# Patient Record
Sex: Female | Born: 1970 | Race: Black or African American | Hispanic: No | Marital: Single | Smoking: Never smoker
Health system: Southern US, Community
[De-identification: ages and names within clinical notes are randomized; demographics above are authoritative.]

## PROBLEM LIST (undated history)

## (undated) DIAGNOSIS — R001 Bradycardia, unspecified: Secondary | ICD-10-CM

## (undated) DIAGNOSIS — G4733 Obstructive sleep apnea (adult) (pediatric): Secondary | ICD-10-CM

## (undated) HISTORY — DX: Bradycardia, unspecified: R00.1

## (undated) HISTORY — PX: TUBAL LIGATION: SHX77

## (undated) HISTORY — DX: Obstructive sleep apnea (adult) (pediatric): G47.33

---

## 2020-04-14 ENCOUNTER — Ambulatory Visit
Admission: EM | Admit: 2020-04-14 | Discharge: 2020-04-14 | Disposition: A | Discharge status: Home / Self Care | Payer: 59 | Attending: Emergency Medicine | Admitting: Emergency Medicine

## 2020-04-14 ENCOUNTER — Other Ambulatory Visit: Payer: Self-pay

## 2020-04-14 ENCOUNTER — Encounter: Payer: Self-pay | Admitting: Emergency Medicine

## 2020-04-14 DIAGNOSIS — M778 Other enthesopathies, not elsewhere classified: Secondary | ICD-10-CM

## 2020-04-14 MED ORDER — CYCLOBENZAPRINE HCL 10 MG PO TABS: 10.0000 mg | ORAL_TABLET | Freq: Every day | ORAL | 0 refills | Status: DC

## 2020-04-14 MED ORDER — PREDNISONE 5 MG/5ML PO SOLN: 5.0000 mg | Freq: Every day | ORAL | 0 refills | Status: DC

## 2020-04-14 NOTE — ED Provider Notes (Signed)
[facility]   [id number] 04/14/20 Arrival Time: 1354   Chief Complaint  Patient presents with  . Shoulder Pain     SUBJECTIVE: History from: patient and family.  Sonya Gray is a 50 y.o. female who presented to the urgent care with a complaint of right shoulder pain that started this past Monday.  Denies any precipitating event, trauma or injury.  Localizes the pain to the right shoulder.  Has tried OTC medication with mild relief.  Report worsening symptoms with range of motion.  Denies similar symptoms in the past.  Denies chills, fever, nausea, vomiting, diarrhea.  ROS: As per HPI.  All other pertinent ROS negative.      History reviewed. No pertinent past medical history. History reviewed. No pertinent surgical history. Allergies  Allergen Reactions  . Cepacol [Benzocaine-Menthol] Hives  . Compazine [Prochlorperazine]    No current facility-administered medications on file prior to encounter.   No current outpatient medications on file prior to encounter.   Social History   Socioeconomic History  . Marital status: Single    Spouse name: Not on file  . Number of children: Not on file  . Years of education: Not on file  . Highest education level: Not on file  Occupational History  . Not on file  Tobacco Use  . Smoking status: Never Smoker  . Smokeless tobacco: Never Used  Vaping Use  . Vaping Use: Never used  Substance and Sexual Activity  . Alcohol use: Yes    Comment: occassionally  . Drug use: Never  . Sexual activity: Not on file  Other Topics Concern  . Not on file  Social History Narrative  . Not on file   Social Determinants of Health   Financial Resource Strain: Not on file  Food Insecurity: Not on file  Transportation Needs: Not on file  Physical Activity: Not on file  Stress: Not on file  Social Connections: Not on file  Intimate Partner Violence: Not on file   Family History  Problem Relation Age of Onset  . Heart failure  Mother   . Cancer Father   . Cancer Sister     OBJECTIVE:  Vitals:   04/14/20 1720 04/14/20 1721  BP: (!) 169/71   Pulse: 69   Resp: 18   Temp: 98.5 F (36.9 C)   TempSrc: Oral   SpO2: 97%   Weight:  180 lb (81.6 kg)  Height:  4\' 11"  (1.499 m)     Physical Exam Vitals and nursing note reviewed.  Constitutional:      General: She is not in acute distress.    Appearance: Normal appearance. She is normal weight. She is not ill-appearing, toxic-appearing or diaphoretic.  HENT:     Head: Normocephalic.  Cardiovascular:     Rate and Rhythm: Normal rate and regular rhythm.     Pulses: Normal pulses.     Heart sounds: Normal heart sounds. No murmur heard. No friction rub. No gallop.   Pulmonary:     Effort: Pulmonary effort is normal. No respiratory distress.     Breath sounds: Normal breath sounds. No stridor. No wheezing, rhonchi or rales.  Chest:     Chest wall: No tenderness.  Musculoskeletal:        General: Tenderness present.     Comments: The right shoulder is without any obvious asymmetry or deformity when compared to the left shoulder.  There is no ecchymosis, open wound, lesion, warmth, swelling or surface trauma present.  Limited  range of motion due to pain.  Neurovascular status intact.  Neurological:     Mental Status: She is alert and oriented to person, place, and time.     LABS:  No results found for this or any previous visit (from the past 24 hour(s)).   ASSESSMENT & PLAN:  1. Tendinitis of right shoulder     Meds ordered this encounter  Medications  . predniSONE 5 MG/5ML solution    Sig: Take 5 mLs (5 mg total) by mouth daily with breakfast for 7 days.    Dispense:  35 mL    Refill:  0  . cyclobenzaprine (FLEXERIL) 10 MG tablet    Sig: Take 1 tablet (10 mg total) by mouth at bedtime.    Dispense:  20 tablet    Refill:  0    Discharge instructions  Prednisone was prescribed/take as directed Flexeril was prescribed/take as directed.  This  medication can make you sleepy therefore do not drive or operate machinery while taking this medication Follow PCP Continue to take OTC Tylenol as needed for pain Return or go to ED if you develop any new or worsening of his symptoms  Reviewed expectations re: course of current medical issues. Questions answered. Outlined signs and symptoms indicating need for more acute intervention. Patient verbalized understanding. After Visit Summary given.         Durward Parcel, FNP 04/14/20 1755

## 2020-04-14 NOTE — ED Triage Notes (Signed)
Arm pain started on Monday today it is hard to raise arm up.

## 2020-04-14 NOTE — Discharge Instructions (Addendum)
Prednisone was prescribed/take as directed Flexeril was prescribed/take as directed.  This medication can make you sleepy therefore do not drive or operate machinery while taking this medication Follow PCP Continue to take OTC Tylenol as needed for pain Return or go to ED if you develop any new or worsening of his symptoms

## 2020-04-19 ENCOUNTER — Ambulatory Visit: Admit: 2020-04-19 | Discharge status: Absent without leave | Payer: Medicaid Other

## 2020-04-19 ENCOUNTER — Ambulatory Visit (INDEPENDENT_AMBULATORY_CARE_PROVIDER_SITE_OTHER): Payer: 59

## 2020-04-19 ENCOUNTER — Ambulatory Visit
Admission: EM | Admit: 2020-04-19 | Discharge: 2020-04-19 | Disposition: A | Discharge status: Home / Self Care | Payer: 59 | Attending: Emergency Medicine | Admitting: Emergency Medicine

## 2020-04-19 ENCOUNTER — Encounter: Payer: Self-pay | Admitting: Emergency Medicine

## 2020-04-19 DIAGNOSIS — M67911 Unspecified disorder of synovium and tendon, right shoulder: Secondary | ICD-10-CM | POA: Diagnosis not present

## 2020-04-19 DIAGNOSIS — M25511 Pain in right shoulder: Secondary | ICD-10-CM

## 2020-04-19 DIAGNOSIS — M19011 Primary osteoarthritis, right shoulder: Secondary | ICD-10-CM

## 2020-04-19 MED ORDER — PREDNISONE 10 MG (21) PO TBPK
ORAL_TABLET | Freq: Every day | ORAL | 0 refills | Status: DC
Start: 2020-04-19 — End: 2020-10-26

## 2020-04-19 MED ORDER — DEXAMETHASONE SODIUM PHOSPHATE 10 MG/ML IJ SOLN
10.0000 mg | Freq: Once | INTRAMUSCULAR | Status: AC
  Administered 2020-04-19: 10 mg via INTRAMUSCULAR

## 2020-04-19 MED ORDER — ACETAMINOPHEN 500 MG PO TABS: 500.0000 mg | ORAL_TABLET | Freq: Four times a day (QID) | ORAL | 0 refills | Status: DC | PRN

## 2020-04-19 NOTE — Discharge Instructions (Addendum)
Prednisone was prescribed/take as directed Decadron IM was given in office Continue to take OTC Tylenol as needed for pain Continue to take Flexeril as needed for muscle spasm Follow-up with PCP Follow RICE instructions that is attached Return or go to ED if you develop any new or worsening of your symptoms

## 2020-04-19 NOTE — ED Provider Notes (Signed)
[facility]   [id number] 04/19/20 Arrival Time: 1325   Chief Complaint  Patient presents with  . Shoulder Pain     SUBJECTIVE: History from: patient.  Sonya Gray is a 50 y.o. female who presented to the urgent care with a complaint of right shoulder pain for the past 10 days.  Denies a precipitating event, trauma or injury.  Was seen previously at the urgent care and was prescribed low-dose prednisone and muscle relaxer with no symptom improvement.  She localizes the pain to the right shoulder.  She describes the pain as constant and achy.  She has tried OTC Tylenol without relief.  Her symptoms are made worse with ROM.  He denies similar symptoms in the past.      ROS: As per HPI.  All other pertinent ROS negative.     History reviewed. No pertinent past medical history. History reviewed. No pertinent surgical history. Allergies  Allergen Reactions  . Cepacol [Benzocaine-Menthol] Hives  . Compazine [Prochlorperazine]    No current facility-administered medications on file prior to encounter.   Current Outpatient Medications on File Prior to Encounter  Medication Sig Dispense Refill  . cyclobenzaprine (FLEXERIL) 10 MG tablet Take 1 tablet (10 mg total) by mouth at bedtime. 20 tablet 0   Social History   Socioeconomic History  . Marital status: Single    Spouse name: Not on file  . Number of children: Not on file  . Years of education: Not on file  . Highest education level: Not on file  Occupational History  . Not on file  Tobacco Use  . Smoking status: Never Smoker  . Smokeless tobacco: Never Used  Vaping Use  . Vaping Use: Never used  Substance and Sexual Activity  . Alcohol use: Yes    Comment: occassionally  . Drug use: Never  . Sexual activity: Not on file  Other Topics Concern  . Not on file  Social History Narrative  . Not on file   Social Determinants of Health   Financial Resource Strain: Not on file  Food Insecurity: Not on file   Transportation Needs: Not on file  Physical Activity: Not on file  Stress: Not on file  Social Connections: Not on file  Intimate Partner Violence: Not on file   Family History  Problem Relation Age of Onset  . Heart failure Mother   . Cancer Father   . Cancer Sister     OBJECTIVE:  Vitals:   04/19/20 1543 04/19/20 1544  BP: (!) 154/89   Pulse: 77   Resp: 18   Temp: 97.8 F (36.6 C)   TempSrc: Oral   SpO2: 96%   Weight:  180 lb (81.6 kg)  Height:  4\' 11"  (1.499 m)     Physical Exam Vitals and nursing note reviewed.  Constitutional:      General: She is not in acute distress.    Appearance: Normal appearance. She is normal weight. She is not ill-appearing, toxic-appearing or diaphoretic.  HENT:     Head: Normocephalic.  Cardiovascular:     Rate and Rhythm: Normal rate and regular rhythm.     Pulses: Normal pulses.     Heart sounds: Normal heart sounds. No murmur heard. No friction rub. No gallop.   Pulmonary:     Effort: Pulmonary effort is normal. No respiratory distress.     Breath sounds: Normal breath sounds. No stridor. No wheezing, rhonchi or rales.  Chest:     Chest wall: No tenderness.  Musculoskeletal:     Right shoulder: Tenderness present.     Comments: The right shoulder is without any obvious asymmetry or deformity when compared to the left shoulder.  There is no ecchymosis, open wound, lesion, warmth or swelling present.  Limited range of motion due to pain.  Neurovascular status intact.  Neurological:     Mental Status: She is alert and oriented to person, place, and time.      LABS:  No results found for this or any previous visit (from the past 24 hour(s)).   RADIOLOGY:  DG Shoulder Right  Result Date: 04/19/2020 CLINICAL DATA:  Right shoulder pain for 1 week.  No known injury. EXAM: RIGHT SHOULDER - 2+ VIEW COMPARISON:  None. FINDINGS: No acute bony or joint abnormality is seen. The patient has mild to moderate acromioclavicular  osteoarthritis. The glenohumeral joint appears normal. Calcifications over the greater tuberosity are consistent with rotator cuff tendinopathy. IMPRESSION: No acute finding. Mild to moderate acromioclavicular osteoarthritis. Calcific rotator cuff tendinopathy. Electronically Signed   By: Drusilla Kanner M.D.   On: 04/19/2020 16:06    Right shoulder x-ray is positive for mild to moderate acromioclavicular osteoarthritis and rotator cuff tendinopathy.  I have reviewed the x-ray myself and the radiologist interpretation.  I am in agreement with the radiologist interpretation.    ASSESSMENT & PLAN:  1. Primary osteoarthritis of right shoulder   2. Tendinopathy of right rotator cuff     Meds ordered this encounter  Medications  . dexamethasone (DECADRON) injection 10 mg  . predniSONE (STERAPRED UNI-PAK 21 TAB) 10 MG (21) TBPK tablet    Sig: Take by mouth daily. Take 6 tabs by mouth daily  for 1 days, then 5 tabs for 1 days, then 4 tabs for 1 days, then 3 tabs for 1 days, 2 tabs for 1 days, then 1 tab by mouth daily for 1 days    Dispense:  21 tablet    Refill:  0    Discharge instructions  Prednisone was prescribed/take as directed Decadron IM was given in office Continue to take OTC Tylenol as needed for pain Continue to take Flexeril as needed for muscle spasm Follow-up with PCP Follow RICE instructions that is attached Return or go to ED if you develop any new or worsening of your symptoms  Reviewed expectations re: course of current medical issues. Questions answered. Outlined signs and symptoms indicating need for more acute intervention. Patient verbalized understanding. After Visit Summary given.         Durward Parcel, FNP 04/19/20 1626

## 2020-04-19 NOTE — ED Triage Notes (Signed)
States steroids and muscle relaxers are not working.  Pain since 1/2

## 2020-10-12 ENCOUNTER — Telehealth: Payer: Self-pay

## 2020-10-12 NOTE — Telephone Encounter (Signed)
Pt contacted [facility] to receive assistance with resouces to receive to establish care with PCP  Chief Complaint(s) Wants to establish care with PCP obtain evalution of her eye after poking her with a tool similar to a make up brush.  States she has a bump on her eye lid, some swelling that has improved, some eye redness  Plan -Reviewed program services and requirements and completed intake screening - Pt was referred to [facility] Program for enrollment and eligibility -Appt was scheduled for Fri. October 15, 2020 at 9:30am with [facility]  - Pt was advised if symptoms worsened (discharge, excessive pain, redness, or any signs of infection persists before getting to PCP then seek urgent or emergency care -Basic first aid measures of warm compress to eye and stye ointment OTC along with good hand hygiene prior to touching eye was advised until able to be seen by PCP

## 2020-10-15 ENCOUNTER — Telehealth: Payer: Self-pay

## 2020-10-15 NOTE — Telephone Encounter (Signed)
Called client who recently enrolled in [facility] to assist in setting up a new patient appointment to establish primary medical care with [facility] client scheduled for 10/26/20 at 1:15PM. Client provided with contact information for the [facility] as well as address.  Plan: follow up with client by phone after her first visit , client agreeable. Mailed client Care manager's contact information as well as a welcome letter from [facility].  Francee Nodal RN [facility] [employer]

## 2020-10-20 ENCOUNTER — Telehealth: Payer: Self-pay

## 2020-10-20 NOTE — Telephone Encounter (Signed)
Client called regarding [facility] upcoming [facility] at 1:15p. Client states she does not have reliable transportation. Discussed using [facility] transportation. . Client agreeable. Plan: called [facility] transportation and arranged transport for 10/26/20. [place] waiver will be signed with [facility] transportation. Will plan follow up with after her initial primary care visit.   Francee Nodal RN [facility]

## 2020-10-21 ENCOUNTER — Telehealth: Payer: Self-pay

## 2020-10-21 NOTE — Telephone Encounter (Signed)
   Sonya Gray DOB: February 25, 1971 MRN: [id number]   RIDER WAIVER AND RELEASE OF LIABILITY  For purposes of improving physical access to our facilities, [facility] is pleased to partner with third parties to provide [facility] patients or other authorized individuals the option of convenient, on-demand ground transportation services (the [employer]") through use of the technology service that enables users to request on-demand ground transportation from independent third-party providers.  By opting to use and accept these [employer], I, the undersigned, hereby agree on behalf of myself, and on behalf of any minor child using the Science writer for whom I am the parent or legal guardian, as follows:  Science writer provided to me are provided by independent third-party transportation providers who are not [employer] or employees and who are unaffiliated with [employer]. [facility] is neither a transportation carrier nor a common or public carrier. [facility] has no control over the quality or safety of the transportation that occurs as a result of the [employer]. [facility] cannot guarantee that any third-party transportation provider will complete any arranged transportation service. [facility] makes no representation, warranty, or guarantee regarding the reliability, timeliness, quality, safety, suitability, or availability of any of the Transport Services or that they will be error free. I fully understand that traveling by vehicle involves risks and dangers of serious bodily injury, including permanent disability, paralysis, and death. I agree, on behalf of myself and on behalf of any minor child using the Transport Services for whom I am the parent or legal guardian, that the entire risk arising out of my use of the [employer] remains solely with me, to the maximum extent permitted under applicable law. The [employer] are provided "as  is" and "as available." [facility] disclaims all representations and warranties, express, implied or statutory, not expressly set out in these terms, including the implied warranties of merchantability and fitness for a particular purpose. I hereby waive and release [facility], its agents, employees, officers, directors, representatives, insurers, attorneys, assigns, successors, subsidiaries, and affiliates from any and all past, present, or future claims, demands, liabilities, actions, causes of action, or suits of any kind directly or indirectly arising from acceptance and use of the [employer]. I further waive and release [facility] and its affiliates from all present and future liability and responsibility for any injury or death to persons or damages to property caused by or related to the use of the [employer]. I have read this Waiver and Release of Liability, and I understand the terms used in it and their legal significance. This Waiver is freely and voluntarily given with the understanding that my right (as well as the right of any minor child for whom I am the parent or legal guardian using the [employer]) to legal recourse against [facility] in connection with the [employer] is knowingly surrendered in return for use of these services.   I attest that I read the consent document to Sonya Gray, gave Sonya Gray the opportunity to ask questions and answered the questions asked (if any). I affirm that Sonya Gray then provided consent for she's participation in this program.     Sonya Gray

## 2020-10-26 ENCOUNTER — Other Ambulatory Visit: Payer: Self-pay | Admitting: Physician Assistant

## 2020-10-26 ENCOUNTER — Telehealth: Payer: Self-pay

## 2020-10-26 ENCOUNTER — Ambulatory Visit: Payer: 59 | Admitting: Physician Assistant

## 2020-10-26 ENCOUNTER — Other Ambulatory Visit: Payer: Self-pay

## 2020-10-26 ENCOUNTER — Encounter: Payer: Self-pay | Admitting: Physician Assistant

## 2020-10-26 VITALS — BP 122/81 | HR 74 | Temp 97.9°F | Ht 60.5 in | Wt 209.0 lb

## 2020-10-26 DIAGNOSIS — Z131 Encounter for screening for diabetes mellitus: Secondary | ICD-10-CM

## 2020-10-26 DIAGNOSIS — Z1211 Encounter for screening for malignant neoplasm of colon: Secondary | ICD-10-CM

## 2020-10-26 DIAGNOSIS — Z1239 Encounter for other screening for malignant neoplasm of breast: Secondary | ICD-10-CM

## 2020-10-26 DIAGNOSIS — Z13220 Encounter for screening for lipoid disorders: Secondary | ICD-10-CM

## 2020-10-26 DIAGNOSIS — Z7689 Persons encountering health services in other specified circumstances: Secondary | ICD-10-CM

## 2020-10-26 NOTE — Telephone Encounter (Signed)
Client called after [facility] appointment. She states her visit went well. She called regarding transportation to have her labs drawn. Plan on 10/29/20 at 0800 to [facility].   [facility] called    Francee Nodal RN [facility]

## 2020-10-26 NOTE — Progress Notes (Signed)
BP 122/81   Pulse 74   Temp 97.9 F (36.6 C)   Ht 5' 0.5" (1.537 m)   Wt 209 lb (94.8 kg)   SpO2 99%   BMI 40.15 kg/m    Subjective:    Patient ID: Sonya Gray, female    DOB: 1970-11-18, 50 y.o.   MRN: [id number]  HPI: Sonya Gray is a 50 y.o. female presenting on 10/26/2020 for New Patient (Initial Visit)   HPI   Pt had a negative covid 19 screening questionnaire.   Chief Complaint  Patient presents with   New Patient (Initial Visit)      Pt has moved here from [place] about a year ago.  She has Not had care since moving.  She moved here to live with her son.    She does not work.   She worked for [employer] in the past.  He has history of Problems with her back.  She saw orthopedic doctors in [place] and says she was diagnosed with pinched nerves.  She says she had MRI maybe 2 years ago.   Her back doesn't prevent her from doing her usual activities.   She says it flares up some times.   Otherwise her only issue is some allergies.    She has never had a colonoscopy.  Her Last mammogram was just over a year ago.  Her Last PAP was about a year ago.     Relevant past medical, surgical, family and social history reviewed and updated as indicated. Interim medical history since our last visit reviewed. Allergies and medications reviewed and updated.   Current Outpatient Medications:    acetaminophen (TYLENOL) 500 MG tablet, Take 1 tablet (500 mg total) by mouth every 6 (six) hours as needed., Disp: 30 tablet, Rfl: 0   Review of Systems  Per HPI unless specifically indicated above     Objective:    BP 122/81   Pulse 74   Temp 97.9 F (36.6 C)   Ht 5' 0.5" (1.537 m)   Wt 209 lb (94.8 kg)   SpO2 99%   BMI 40.15 kg/m   Wt Readings from Last 3 Encounters:  10/26/20 209 lb (94.8 kg)  04/19/20 180 lb (81.6 kg)  04/14/20 180 lb (81.6 kg)    Physical Exam Vitals reviewed.  Constitutional:      General: She is not in acute distress.    Appearance: She is  well-developed. She is obese. She is not ill-appearing.  HENT:     Head: Normocephalic and atraumatic.     Right Ear: Tympanic membrane, ear canal and external ear normal.     Left Ear: Tympanic membrane, ear canal and external ear normal.  Eyes:     Extraocular Movements: Extraocular movements intact.     Conjunctiva/sclera: Conjunctivae normal.     Pupils: Pupils are equal, round, and reactive to light.  Neck:     Thyroid: No thyromegaly.  Cardiovascular:     Rate and Rhythm: Normal rate and regular rhythm.  Pulmonary:     Effort: Pulmonary effort is normal.     Breath sounds: Normal breath sounds.  Abdominal:     General: Bowel sounds are normal.     Palpations: Abdomen is soft. There is no mass.     Tenderness: There is no abdominal tenderness.  Musculoskeletal:     Cervical back: Neck supple.     Right lower leg: No edema.     Left lower leg: No edema.  Lymphadenopathy:     Cervical: No cervical adenopathy.  Skin:    General: Skin is warm and dry.  Neurological:     Mental Status: She is alert and oriented to person, place, and time.     Motor: No weakness or tremor.     Gait: Gait normal.     Deep Tendon Reflexes:     Reflex Scores:      Patellar reflexes are 2+ on the right side and 2+ on the left side. Psychiatric:        Attention and Perception: Attention normal.        Mood and Affect: Mood normal.        Speech: Speech normal.        Behavior: Behavior normal. Behavior is cooperative.     Comments: Engages easily.  Pleasant.     No results found for this or any previous visit.    Assessment & Plan:    Encounter Diagnoses  Name Primary?   Encounter to establish care Yes   Morbid obesity (HCC)    Screening for colon cancer    Encounter for screening for malignant neoplasm of breast, unspecified screening modality    Screening cholesterol level    Screening for diabetes mellitus      -pt was given FIT test for colon cancer screening -Record request  sent for most recent PAP and orthopedic records/MRI -will refer for screening Mammogram -pt is put on Dental list -will get Baseline labs -encouraged weight loss with healthy diet and reguar exercise -pt to follow up  1 month.  She is to contact office sooner prn

## 2020-10-27 ENCOUNTER — Other Ambulatory Visit: Payer: Self-pay | Admitting: Physician Assistant

## 2020-10-27 DIAGNOSIS — Z1239 Encounter for other screening for malignant neoplasm of breast: Secondary | ICD-10-CM

## 2020-10-29 ENCOUNTER — Other Ambulatory Visit (HOSPITAL_COMMUNITY)
Admission: RE | Admit: 2020-10-29 | Discharge: 2020-10-29 | Disposition: A | Discharge status: Home / Self Care | Payer: Medicaid Other | Source: Ambulatory Visit | Attending: Physician Assistant | Admitting: Physician Assistant

## 2020-10-29 DIAGNOSIS — Z13220 Encounter for screening for lipoid disorders: Secondary | ICD-10-CM | POA: Insufficient documentation

## 2020-10-29 DIAGNOSIS — Z131 Encounter for screening for diabetes mellitus: Secondary | ICD-10-CM | POA: Insufficient documentation

## 2020-10-29 LAB — COMPREHENSIVE METABOLIC PANEL
ALT: 19 U/L (ref 0–44)
AST: 17 U/L (ref 15–41)
Albumin: 4.1 g/dL (ref 3.5–5.0)
Alkaline Phosphatase: 64 U/L (ref 38–126)
Anion gap: 7 (ref 5–15)
BUN: 9 mg/dL (ref 6–20)
CO2: 26 mmol/L (ref 22–32)
Calcium: 8.7 mg/dL — ABNORMAL LOW (ref 8.9–10.3)
Chloride: 108 mmol/L (ref 98–111)
Creatinine, Ser: 0.64 mg/dL (ref 0.44–1.00)
GFR, Estimated: >60 mL/min (ref >60)
Glucose, Bld: 115 mg/dL — ABNORMAL HIGH (ref 70–99)
Potassium: 3.7 mmol/L (ref 3.5–5.1)
Sodium: 141 mmol/L (ref 135–145)
Total Bilirubin: 0.5 mg/dL (ref 0.3–1.2)
Total Protein: 7.2 g/dL (ref 6.5–8.1)

## 2020-10-29 LAB — LIPID PANEL
Cholesterol: 228 mg/dL — ABNORMAL HIGH (ref 0–200)
HDL: 60 mg/dL (ref >40)
LDL Cholesterol: 156 mg/dL — ABNORMAL HIGH (ref 0–99)
Total CHOL/HDL Ratio: 3.8 RATIO
Triglycerides: 58 mg/dL (ref <150)
VLDL: 12 mg/dL (ref 0–40)

## 2020-10-30 LAB — HEMOGLOBIN A1C
Hgb A1c MFr Bld: 6.1 % — ABNORMAL HIGH (ref 4.8–5.6)
Mean Plasma Glucose: 128 mg/dL

## 2020-11-04 ENCOUNTER — Other Ambulatory Visit (HOSPITAL_COMMUNITY): Payer: Self-pay | Admitting: Family Medicine

## 2020-11-04 ENCOUNTER — Ambulatory Visit (HOSPITAL_COMMUNITY)
Admission: RE | Admit: 2020-11-04 | Discharge: 2020-11-04 | Disposition: A | Discharge status: Home / Self Care | Payer: Self-pay | Source: Ambulatory Visit | Attending: Physician Assistant | Admitting: Physician Assistant

## 2020-11-04 ENCOUNTER — Other Ambulatory Visit: Payer: Self-pay

## 2020-11-04 DIAGNOSIS — Z1239 Encounter for other screening for malignant neoplasm of breast: Secondary | ICD-10-CM | POA: Insufficient documentation

## 2020-11-08 LAB — IFOBT (OCCULT BLOOD): IFOBT: NEGATIVE

## 2020-11-29 ENCOUNTER — Ambulatory Visit: Payer: Medicaid Other | Admitting: Physician Assistant

## 2020-11-29 ENCOUNTER — Encounter: Payer: Self-pay | Admitting: Physician Assistant

## 2020-11-29 DIAGNOSIS — E785 Hyperlipidemia, unspecified: Secondary | ICD-10-CM

## 2020-11-29 DIAGNOSIS — R7303 Prediabetes: Secondary | ICD-10-CM

## 2020-11-29 DIAGNOSIS — E669 Obesity, unspecified: Secondary | ICD-10-CM

## 2020-11-29 NOTE — Patient Instructions (Signed)
High Cholesterol  High cholesterol is a condition in which the blood has high levels of a white, waxy substance similar to fat (cholesterol). The liver makes all the cholesterol that the body needs. The human body needs small amounts of cholesterol to help build cells. A person gets extra orexcess cholesterol from the food that he or she eats. The blood carries cholesterol from the liver to the rest of the body. If you have high cholesterol, deposits (plaques) may build up on the walls of your arteries. Arteries are the blood vessels that carry blood away from your heart. These plaques make the arteries narrowand stiff. Cholesterol plaques increase your risk for heart attack and stroke. Work withyour health care provider to keep your cholesterol levels in a healthy range. What increases the risk? The following factors may make you more likely to develop this condition: Eating foods that are high in animal fat (saturated fat) or cholesterol. Being overweight. Not getting enough exercise. A family history of high cholesterol (familial hypercholesterolemia). Use of tobacco products. Having diabetes. What are the signs or symptoms? There are no symptoms of this condition. How is this diagnosed? This condition may be diagnosed based on the results of a blood test. If you are older than 52 years of age, your health care provider may check your cholesterol levels every 4-6 years. You may be checked more often if you have high cholesterol or other risk factors for heart disease. The blood test for cholesterol measures: "Bad" cholesterol, or LDL cholesterol. This is the main type of cholesterol that causes heart disease. The desired level is less than 100 mg/dL. "Good" cholesterol, or HDL cholesterol. HDL helps protect against heart disease by cleaning the arteries and carrying the LDL to the liver for processing. The desired level for HDL is 60 mg/dL or higher. Triglycerides. These are fats that your  body can store or burn for energy. The desired level is less than 150 mg/dL. Total cholesterol. This measures the total amount of cholesterol in your blood and includes LDL, HDL, and triglycerides. The desired level is less than 200 mg/dL. How is this treated? This condition may be treated with: Diet changes. You may be asked to eat foods that have more fiber and less saturated fats or added sugar. Lifestyle changes. These may include regular exercise, maintaining a healthy weight, and quitting use of tobacco products. Medicines. These are given when diet and lifestyle changes have not worked. You may be prescribed a statin medicine to help lower your cholesterol levels. Follow these instructions at home: Eating and drinking  Eat a healthy, balanced diet. This diet includes: Daily servings of a variety of fresh, frozen, or canned fruits and vegetables. Daily servings of whole grain foods that are rich in fiber. Foods that are low in saturated fats and trans fats. These include poultry and fish without skin, lean cuts of meat, and low-fat dairy products. A variety of fish, especially oily fish that contain omega-3 fatty acids. Aim to eat fish at least 2 times a week. Avoid foods and drinks that have added sugar. Use healthy cooking methods, such as roasting, grilling, broiling, baking, poaching, steaming, and stir-frying. Do not fry your food except for stir-frying.  Lifestyle  Get regular exercise. Aim to exercise for a total of 150 minutes a week. Increase your activity level by doing activities such as gardening, walking, and taking the stairs. Do not use any products that contain nicotine or tobacco, such as cigarettes, e-cigarettes, and chewing tobacco.   If you need help quitting, ask your health care provider.  General instructions Take over-the-counter and prescription medicines only as told by your health care provider. Keep all follow-up visits as told by your health care provider.  This is important. Where to find more information American Heart Association: www.heart.org National Heart, Lung, and Blood Institute: www.nhlbi.nih.gov Contact a health care provider if: You have trouble achieving or maintaining a healthy diet or weight. You are starting an exercise program. You are unable to stop smoking. Get help right away if: You have chest pain. You have trouble breathing. You have any symptoms of a stroke. "BE FAST" is an easy way to remember the main warning signs of a stroke: B - Balance. Signs are dizziness, sudden trouble walking, or loss of balance. E - Eyes. Signs are trouble seeing or a sudden change in vision. F - Face. Signs are sudden weakness or numbness of the face, or the face or eyelid drooping on one side. A - Arms. Signs are weakness or numbness in an arm. This happens suddenly and usually on one side of the body. S - Speech. Signs are sudden trouble speaking, slurred speech, or trouble understanding what people say. T - Time. Time to call emergency services. Write down what time symptoms started. You have other signs of a stroke, such as: A sudden, severe headache with no known cause. Nausea or vomiting. Seizure. These symptoms may represent a serious problem that is an emergency. Do not wait to see if the symptoms will go away. Get medical help right away. Call your local emergency services (911 in the [place] Do not drive yourself to the hospital. Summary Cholesterol plaques increase your risk for heart attack and stroke. Work with your health care provider to keep your cholesterol levels in a healthy range. Eat a healthy, balanced diet, get regular exercise, and maintain a healthy weight. Do not use any products that contain nicotine or tobacco, such as cigarettes, e-cigarettes, and chewing tobacco. Get help right away if you have any symptoms of a stroke. This information is not intended to replace advice given to you by your health care  provider. Make sure you discuss any questions you have with your healthcare provider. Document Revised: 02/24/2019 Document Reviewed: 02/24/2019 Elsevier Patient Education  2022 Elsevier Inc.  

## 2020-11-29 NOTE — Progress Notes (Signed)
There were no vitals taken for this visit.   Subjective:    Patient ID: Sonya Gray, female    DOB: 1970/07/06, 50 y.o.   MRN: [id number]  HPI: Sonya Gray is a 50 y.o. female presenting on 11/29/2020 for No chief complaint on file.   HPI   This is a telemedicine appointment through [employer].  I connected with  Sonya Gray on 11/29/20 by a video enabled telemedicine application and verified that I am speaking with the correct person using two identifiers.   I discussed the limitations of evaluation and management by telemedicine. The patient expressed understanding and agreed to proceed.  Sonya Gray is at home.  Provider is at office.    Sonya Gray is 50yoF who presented to office last month to establish care.  Her appointment today is to review labs, HCM.  She was given option of in-person appointment or virtual and she selected virtual.  She says she is doing well and has no complaints.      Relevant past medical, surgical, family and social history reviewed and updated as indicated. Interim medical history since our last visit reviewed. Allergies and medications reviewed and updated.  No current outpatient medications on file.    Review of Systems  Per HPI unless specifically indicated above     Objective:    There were no vitals taken for this visit.  Wt Readings from Last 3 Encounters:  10/26/20 209 lb (94.8 kg)  04/19/20 180 lb (81.6 kg)  04/14/20 180 lb (81.6 kg)    Physical Exam Constitutional:      General: She is not in acute distress.    Appearance: She is obese. She is not ill-appearing.  HENT:     Head: Normocephalic and atraumatic.  Pulmonary:     Effort: No respiratory distress.  Neurological:     Mental Status: She is alert and oriented to person, place, and time.  Psychiatric:        Attention and Perception: Attention normal.        Speech: Speech normal.     Comments: Pleasant and engaged      Results for orders placed or performed during the  hospital encounter of 10/29/20  Hemoglobin A1c  Result Value Ref Range   Hgb A1c MFr Bld 6.1 (H) 4.8 - 5.6 %   Mean Plasma Glucose 128 mg/dL  Lipid panel  Result Value Ref Range   Cholesterol 228 (H) 0 - 200 mg/dL   Triglycerides 58 <086 mg/dL   HDL 60 >57 mg/dL   Total CHOL/HDL Ratio 3.8 RATIO   VLDL 12 0 - 40 mg/dL   LDL Cholesterol 846 (H) 0 - 99 mg/dL  Comprehensive metabolic panel  Result Value Ref Range   Sodium 141 135 - 145 mmol/L   Potassium 3.7 3.5 - 5.1 mmol/L   Chloride 108 98 - 111 mmol/L   CO2 26 22 - 32 mmol/L   Glucose, Bld 115 (H) 70 - 99 mg/dL   BUN 9 6 - 20 mg/dL   Creatinine, Ser 9.62 0.44 - 1.00 mg/dL   Calcium 8.7 (L) 8.9 - 10.3 mg/dL   Total Protein 7.2 6.5 - 8.1 g/dL   Albumin 4.1 3.5 - 5.0 g/dL   AST 17 15 - 41 U/L   ALT 19 0 - 44 U/L   Alkaline Phosphatase 64 38 - 126 U/L   Total Bilirubin 0.5 0.3 - 1.2 mg/dL   GFR, Estimated >95 >28 mL/min   Anion gap 7 5 -  15        Assessment & Plan:    Encounter Diagnoses  Name Primary?   Hyperlipidemia, unspecified hyperlipidemia type Yes   Prediabetes    Obesity, unspecified classification, unspecified obesity type, unspecified whether serious comorbidity present        -Reviewed labs with Sonya Gray  -discussed medication for the cholesterol elevation but Sonya Gray wishes to lower it with diet and exercise.  She is counseled on lowfat diet.  She will be mailed reading information on this -Sonya Gray is counseled on prediabetes and discussed strategies to slow progression to diabetes including healthy diet, regular exercise and weight management -Sonya Gray to follow up in 3 months to recheck lipids.  She is to contact office sooner prn

## 2020-12-07 ENCOUNTER — Encounter: Payer: Self-pay | Admitting: Physician Assistant

## 2021-01-10 ENCOUNTER — Ambulatory Visit: Payer: Medicaid Other | Admitting: Physician Assistant

## 2021-02-16 ENCOUNTER — Other Ambulatory Visit: Payer: Self-pay | Admitting: Physician Assistant

## 2021-02-16 DIAGNOSIS — E785 Hyperlipidemia, unspecified: Secondary | ICD-10-CM

## 2021-03-08 ENCOUNTER — Encounter: Payer: Self-pay | Admitting: Physician Assistant

## 2021-03-08 ENCOUNTER — Ambulatory Visit: Payer: Medicaid Other | Admitting: Physician Assistant

## 2021-03-10 ENCOUNTER — Encounter: Payer: Self-pay | Admitting: Physician Assistant

## 2021-03-11 ENCOUNTER — Other Ambulatory Visit (HOSPITAL_COMMUNITY)
Admission: RE | Admit: 2021-03-11 | Discharge: 2021-03-11 | Disposition: A | Discharge status: Home / Self Care | Payer: Medicaid Other | Source: Ambulatory Visit | Attending: Physician Assistant | Admitting: Physician Assistant

## 2021-03-11 DIAGNOSIS — E785 Hyperlipidemia, unspecified: Secondary | ICD-10-CM | POA: Insufficient documentation

## 2021-03-11 LAB — HEPATIC FUNCTION PANEL
ALT: 26 U/L (ref 0–44)
AST: 22 U/L (ref 15–41)
Albumin: 3.9 g/dL (ref 3.5–5.0)
Alkaline Phosphatase: 67 U/L (ref 38–126)
Bilirubin, Direct: 0.1 mg/dL (ref 0.0–0.2)
Indirect Bilirubin: 0.3 mg/dL (ref 0.3–0.9)
Total Bilirubin: 0.4 mg/dL (ref 0.3–1.2)
Total Protein: 6.8 g/dL (ref 6.5–8.1)

## 2021-03-11 LAB — LIPID PANEL
Cholesterol: 241 mg/dL — ABNORMAL HIGH (ref 0–200)
HDL: 56 mg/dL (ref >40)
LDL Cholesterol: 160 mg/dL — ABNORMAL HIGH (ref 0–99)
Total CHOL/HDL Ratio: 4.3 RATIO
Triglycerides: 124 mg/dL (ref <150)
VLDL: 25 mg/dL (ref 0–40)

## 2021-03-15 ENCOUNTER — Ambulatory Visit: Payer: Medicaid Other | Admitting: Physician Assistant

## 2021-03-15 ENCOUNTER — Encounter: Payer: Self-pay | Admitting: Physician Assistant

## 2021-03-15 DIAGNOSIS — E785 Hyperlipidemia, unspecified: Secondary | ICD-10-CM

## 2021-03-15 MED ORDER — ATORVASTATIN CALCIUM 20 MG PO TABS: 20.0000 mg | ORAL_TABLET | Freq: Every day | ORAL | 1 refills | Status: DC

## 2021-03-15 NOTE — Progress Notes (Signed)
   There were no vitals taken for this visit.   Subjective:    Patient ID: Sonya Gray, female    DOB: [birth year], 50 y.o.   MRN: [id number]  HPI: Sonya Gray is a 50 y.o. female presenting on 03/15/2021 for No chief complaint on file.   HPI  This is a telemedicine appointment through [employer]  I connected with  [name] on 03/15/21 by a video enabled telemedicine application and verified that I am speaking with the correct person using two identifiers.   I discussed the limitations of evaluation and management by telemedicine. The patient expressed understanding and agreed to proceed.  Pt is at home.  Provider is at office.   Pt is 50yoF with routine appointment to follow up dyslipidemia.  She is doing well and has no complaints today.     Relevant past medical, surgical, family and social history reviewed and updated as indicated. Interim medical history since our last visit reviewed. Allergies and medications reviewed and updated.  CURRENT MEDS: none  Review of Systems  Per HPI unless specifically indicated above     Objective:    There were no vitals taken for this visit.  Wt Readings from Last 3 Encounters:  10/26/20 209 lb (94.8 kg)  04/19/20 180 lb (81.6 kg)  04/14/20 180 lb (81.6 kg)    Physical Exam Constitutional:      General: She is not in acute distress.    Appearance: She is not ill-appearing.  HENT:     Head: Normocephalic and atraumatic.  Pulmonary:     Effort: No respiratory distress.     Comments: Pt is talking in complete sentences without dyspnea. Neurological:     Mental Status: She is alert and oriented to person, place, and time.  Psychiatric:        Attention and Perception: Attention normal.        Speech: Speech normal.        Behavior: Behavior is cooperative.    Results for orders placed or performed during the hospital encounter of 03/11/21  Lipid panel  Result Value Ref Range   Cholesterol 241 (H) 0 - 200 mg/dL    Triglycerides 671 <245 mg/dL   HDL 56 >80 mg/dL   Total CHOL/HDL Ratio 4.3 RATIO   VLDL 25 0 - 40 mg/dL   LDL Cholesterol 998 (H) 0 - 99 mg/dL  Hepatic function panel  Result Value Ref Range   Total Protein 6.8 6.5 - 8.1 g/dL   Albumin 3.9 3.5 - 5.0 g/dL   AST 22 15 - 41 U/L   ALT 26 0 - 44 U/L   Alkaline Phosphatase 67 38 - 126 U/L   Total Bilirubin 0.4 0.3 - 1.2 mg/dL   Bilirubin, Direct 0.1 0.0 - 0.2 mg/dL   Indirect Bilirubin 0.3 0.3 - 0.9 mg/dL        Assessment & Plan:   Encounter Diagnosis  Name Primary?   Hyperlipidemia, unspecified hyperlipidemia type Yes      -reviewed labs with pt -start Atorvastatin.  Pt was counseled to continue lowfat diet and regular exercise was encouraged -pt to follow up 3 months.  She is to contact office sooner prn

## 2021-05-26 ENCOUNTER — Encounter: Payer: Self-pay | Admitting: Physician Assistant

## 2021-05-26 ENCOUNTER — Other Ambulatory Visit: Payer: Self-pay

## 2021-05-26 ENCOUNTER — Ambulatory Visit: Payer: Medicaid Other | Admitting: Physician Assistant

## 2021-05-26 VITALS — BP 123/74 | HR 64 | Temp 97.2°F | Wt 195.0 lb

## 2021-05-26 DIAGNOSIS — M62838 Other muscle spasm: Secondary | ICD-10-CM

## 2021-05-26 DIAGNOSIS — E785 Hyperlipidemia, unspecified: Secondary | ICD-10-CM

## 2021-05-26 MED ORDER — CYCLOBENZAPRINE HCL 10 MG PO TABS: 10.0000 mg | ORAL_TABLET | Freq: Three times a day (TID) | ORAL | 0 refills | Status: DC | PRN

## 2021-05-26 NOTE — Progress Notes (Signed)
° °  BP 123/74    Pulse 64    Temp (!) 97.2 F (36.2 C)    Wt 195 lb (88.5 kg)    SpO2 95%    BMI 37.46 kg/m    Subjective:    Patient ID: Sonya Gray, female    DOB: 23-Sep-1970, 51 y.o.   MRN: [id number]  HPI: Sonya Gray is a 51 y.o. female presenting on 05/26/2021 for Spasms and Back Pain   HPI  Chief Complaint  Patient presents with   Spasms   Back Pain    Pt says spasms and pain Started Thursday.   It Started gradually.  She does not work.  She is not doing any physical things lately out of usual.  The pain is in her shoulders and back.   No numbness or tingling.    Relevant past medical, surgical, family and social history reviewed and updated as indicated. Interim medical history since our last visit reviewed. Allergies and medications reviewed and updated.    Current Outpatient Medications:    atorvastatin (LIPITOR) 20 MG tablet, Take 1 tablet (20 mg total) by mouth daily., Disp: 90 tablet, Rfl: 1   Review of Systems  Per HPI unless specifically indicated above     Objective:    BP 123/74    Pulse 64    Temp (!) 97.2 F (36.2 C)    Wt 195 lb (88.5 kg)    SpO2 95%    BMI 37.46 kg/m   Wt Readings from Last 3 Encounters:  05/26/21 195 lb (88.5 kg)  10/26/20 209 lb (94.8 kg)  04/19/20 180 lb (81.6 kg)    Physical Exam Constitutional:      General: She is not in acute distress.    Appearance: She is obese. She is not ill-appearing.  HENT:     Head: Normocephalic and atraumatic.  Pulmonary:     Effort: Pulmonary effort is normal. No respiratory distress.     Breath sounds: Normal breath sounds. No wheezing.  Musculoskeletal:     Right shoulder: Normal range of motion.     Left shoulder: Normal range of motion.       Arms:     Cervical back: Neck supple. Tenderness present. No swelling, rigidity or bony tenderness. Normal range of motion.     Thoracic back: Tenderness present. No bony tenderness.     Comments: Soft tissue tenderness trapezius, lats  etc.  No bony tenderness.  FROM.   Lymphadenopathy:     Cervical: No cervical adenopathy.  Neurological:     Mental Status: She is alert and oriented to person, place, and time.  Psychiatric:        Attention and Perception: Attention normal.        Mood and Affect: Mood normal.        Speech: Speech normal.        Behavior: Behavior is cooperative.          Assessment & Plan:   Encounter Diagnoses  Name Primary?   Muscle spasm Yes   Hyperlipidemia, unspecified hyperlipidemia type      -pt encouraged to apply Heat 10-20 minutes several times daily, use OTC Nsaids.  She is given rx for Flexeril.  She is counseled to avoid driving on this medication which can cause sedation. -pt to follow up next month as scheduled.  She is to contact office sooner prn

## 2021-05-30 ENCOUNTER — Telehealth: Payer: Self-pay

## 2021-05-30 NOTE — Telephone Encounter (Signed)
Contacted [employer] on behalf of patient for 2.24.23 appt 10:30am with [facility] and confirmed with Romeo Apple at [facility].

## 2021-06-13 ENCOUNTER — Telehealth: Payer: Self-pay

## 2021-06-13 NOTE — Telephone Encounter (Signed)
Contacted [employer] to assist patient with arranging a ride for her upcoming labwork appt at [facility]. ? ? ?A message was left with [facility] to  return call back to W.Sherman Donaldson, LPN to confirm the ride due to unavailability at the time of call. ? ? ? ? ? ?

## 2021-06-15 ENCOUNTER — Other Ambulatory Visit (HOSPITAL_COMMUNITY)
Admission: RE | Admit: 2021-06-15 | Discharge: 2021-06-15 | Disposition: A | Discharge status: Home / Self Care | Payer: Medicaid Other | Source: Ambulatory Visit | Attending: Physician Assistant | Admitting: Physician Assistant

## 2021-06-15 DIAGNOSIS — E785 Hyperlipidemia, unspecified: Secondary | ICD-10-CM | POA: Insufficient documentation

## 2021-06-15 LAB — HEPATIC FUNCTION PANEL
ALT: 23 U/L (ref 0–44)
AST: 20 U/L (ref 15–41)
Albumin: 3.9 g/dL (ref 3.5–5.0)
Alkaline Phosphatase: 72 U/L (ref 38–126)
Bilirubin, Direct: 0.1 mg/dL (ref 0.0–0.2)
Indirect Bilirubin: 0.5 mg/dL (ref 0.3–0.9)
Total Bilirubin: 0.6 mg/dL (ref 0.3–1.2)
Total Protein: 7 g/dL (ref 6.5–8.1)

## 2021-06-15 LAB — LIPID PANEL
Cholesterol: 146 mg/dL (ref 0–200)
HDL: 50 mg/dL (ref >40)
LDL Cholesterol: 79 mg/dL (ref 0–99)
Total CHOL/HDL Ratio: 2.9 RATIO
Triglycerides: 84 mg/dL (ref <150)
VLDL: 17 mg/dL (ref 0–40)

## 2021-06-20 ENCOUNTER — Ambulatory Visit: Payer: Medicaid Other | Admitting: Physician Assistant

## 2021-06-20 ENCOUNTER — Encounter: Payer: Self-pay | Admitting: Physician Assistant

## 2021-06-20 DIAGNOSIS — E785 Hyperlipidemia, unspecified: Secondary | ICD-10-CM

## 2021-06-20 NOTE — Progress Notes (Signed)
? ?  There were no vitals taken for this visit.  ? ?Subjective:  ? ? Patient ID: Sonya Gray, female    DOB: 1970/12/28, 51 y.o.   MRN: [id number] ? ?HPI: ?Sonya Gray is a 51 y.o. female presenting on 06/20/2021 for No chief complaint on file. ? ? ?HPI ? ? ?This is a telemedicine appointment through [employer]. ? ?I connected with  Sonya Gray on 06/20/21 by a video enabled telemedicine application and verified that I am speaking with the correct person using two identifiers. ?  ?I discussed the limitations of evaluation and management by telemedicine. The patient expressed understanding and agreed to proceed. ? ?Pt is at home.  Provider is at office. ? ? ?Pt is 50yoF with routine appointment to follow up dyslipidemia.  She says she is doing well and has no complaints.  She has been working hard on eating healthy.   ? ?Relevant past medical, surgical, family and social history reviewed and updated as indicated. Interim medical history since our last visit reviewed. ?Allergies and medications reviewed and updated. ? ?Review of Systems ? ?Per HPI unless specifically indicated above ? ?   ?Objective:  ?  ?There were no vitals taken for this visit.  ?Wt Readings from Last 3 Encounters:  ?05/26/21 195 lb (88.5 kg)  ?10/26/20 209 lb (94.8 kg)  ?04/19/20 180 lb (81.6 kg)  ?  ?Physical Exam ?Constitutional:   ?   General: She is not in acute distress. ?   Appearance: She is not toxic-appearing.  ?HENT:  ?   Head: Normocephalic and atraumatic.  ?Pulmonary:  ?   Effort: Pulmonary effort is normal. No respiratory distress.  ?   Comments: Pt is talking in complete sentences without dyspnea. ?Neurological:  ?   Mental Status: She is alert and oriented to person, place, and time.  ?Psychiatric:     ?   Mood and Affect: Mood normal.     ?   Speech: Speech normal.     ?   Behavior: Behavior normal. Behavior is cooperative.  ? ? ?Results for orders placed or performed during the hospital encounter of 06/15/21  ?Hepatic function  panel  ?Result Value Ref Range  ? Total Protein 7.0 6.5 - 8.1 g/dL  ? Albumin 3.9 3.5 - 5.0 g/dL  ? AST 20 15 - 41 U/L  ? ALT 23 0 - 44 U/L  ? Alkaline Phosphatase 72 38 - 126 U/L  ? Total Bilirubin 0.6 0.3 - 1.2 mg/dL  ? Bilirubin, Direct 0.1 0.0 - 0.2 mg/dL  ? Indirect Bilirubin 0.5 0.3 - 0.9 mg/dL  ?Lipid panel  ?Result Value Ref Range  ? Cholesterol 146 0 - 200 mg/dL  ? Triglycerides 84 <150 mg/dL  ? HDL 50 >40 mg/dL  ? Total CHOL/HDL Ratio 2.9 RATIO  ? VLDL 17 0 - 40 mg/dL  ? LDL Cholesterol 79 0 - 99 mg/dL  ? ?   ?Assessment & Plan:  ? ? ?Encounter Diagnosis  ?Name Primary?  ? Hyperlipidemia, unspecified hyperlipidemia type Yes  ? ? ? ?-Reviewed labs with pt ?-Pt to continue atorvastatin and lowfat diet ?-Pt to follow up 3 months.  She is to contact office sooner prn ? ?

## 2021-09-12 ENCOUNTER — Other Ambulatory Visit: Payer: Self-pay | Admitting: Physician Assistant

## 2021-09-12 DIAGNOSIS — R7303 Prediabetes: Secondary | ICD-10-CM

## 2021-09-12 DIAGNOSIS — E785 Hyperlipidemia, unspecified: Secondary | ICD-10-CM

## 2021-09-21 ENCOUNTER — Other Ambulatory Visit (HOSPITAL_COMMUNITY)
Admission: RE | Admit: 2021-09-21 | Discharge: 2021-09-21 | Disposition: A | Discharge status: Home / Self Care | Payer: Self-pay | Source: Ambulatory Visit | Attending: Physician Assistant | Admitting: Physician Assistant

## 2021-09-21 DIAGNOSIS — R7303 Prediabetes: Secondary | ICD-10-CM

## 2021-09-21 DIAGNOSIS — E785 Hyperlipidemia, unspecified: Secondary | ICD-10-CM

## 2021-09-21 LAB — LIPID PANEL
Cholesterol: 150 mg/dL (ref 0–200)
HDL: 49 mg/dL (ref >40)
LDL Cholesterol: 81 mg/dL (ref 0–99)
Total CHOL/HDL Ratio: 3.1 RATIO
Triglycerides: 102 mg/dL (ref <150)
VLDL: 20 mg/dL (ref 0–40)

## 2021-09-21 LAB — COMPREHENSIVE METABOLIC PANEL
ALT: 23 U/L (ref 0–44)
AST: 19 U/L (ref 15–41)
Albumin: 3.9 g/dL (ref 3.5–5.0)
Alkaline Phosphatase: 80 U/L (ref 38–126)
Anion gap: 7 (ref 5–15)
BUN: 8 mg/dL (ref 6–20)
CO2: 26 mmol/L (ref 22–32)
Calcium: 9.3 mg/dL (ref 8.9–10.3)
Chloride: 107 mmol/L (ref 98–111)
Creatinine, Ser: 0.6 mg/dL (ref 0.44–1.00)
GFR, Estimated: >60 mL/min (ref >60)
Glucose, Bld: 116 mg/dL — ABNORMAL HIGH (ref 70–99)
Potassium: 3.7 mmol/L (ref 3.5–5.1)
Sodium: 140 mmol/L (ref 135–145)
Total Bilirubin: 0.6 mg/dL (ref 0.3–1.2)
Total Protein: 7.1 g/dL (ref 6.5–8.1)

## 2021-09-21 LAB — HEMOGLOBIN A1C
Hgb A1c MFr Bld: 5.7 % — ABNORMAL HIGH (ref 4.8–5.6)
Mean Plasma Glucose: 116.89 mg/dL

## 2021-09-26 ENCOUNTER — Encounter: Payer: Self-pay | Admitting: Physician Assistant

## 2021-09-26 ENCOUNTER — Ambulatory Visit: Payer: Medicaid Other | Admitting: Physician Assistant

## 2021-09-26 DIAGNOSIS — E785 Hyperlipidemia, unspecified: Secondary | ICD-10-CM

## 2021-09-26 DIAGNOSIS — R7303 Prediabetes: Secondary | ICD-10-CM

## 2021-09-26 NOTE — Progress Notes (Signed)
There were no vitals taken for this visit.   Subjective:    Patient ID: Sonya Gray, female    DOB: 11-13-1970, 51 y.o.   MRN: [id number]  HPI: Sonya Gray is a 51 y.o. female presenting on 09/26/2021 for No chief complaint on file.   HPI  This is a telemedicine appointment through [employer].  I connected with  Sonya Gray on 09/26/21 by a video enabled telemedicine application and verified that I am speaking with the correct person using two identifiers.   I discussed the limitations of evaluation and management by telemedicine. The patient expressed understanding and agreed to proceed.  Pt is at home.  Provider is at office.   Pt is a 50yoF with dyslipidemia.  She says she is doing well and she has no complaints.   She is continuing to eat lowfat, healthy diet.     Relevant past medical, surgical, family and social history reviewed and updated as indicated. Interim medical history since our last visit reviewed. Allergies and medications reviewed and updated.   Current Outpatient Medications:    atorvastatin (LIPITOR) 20 MG tablet, Take 1 tablet (20 mg total) by mouth daily., Disp: 90 tablet, Rfl: 1    Review of Systems  Per HPI unless specifically indicated above     Objective:    There were no vitals taken for this visit.  Wt Readings from Last 3 Encounters:  05/26/21 195 lb (88.5 kg)  10/26/20 209 lb (94.8 kg)  04/19/20 180 lb (81.6 kg)    Physical Exam Constitutional:      General: She is not in acute distress.    Appearance: She is not ill-appearing.  HENT:     Head: Normocephalic and atraumatic.  Pulmonary:     Effort: Pulmonary effort is normal. No respiratory distress.     Comments: Pt is talking in complete sentences without dyspnea Neurological:     Mental Status: She is alert and oriented to person, place, and time.  Psychiatric:        Mood and Affect: Mood normal.        Behavior: Behavior normal.     Results for orders placed or  performed during the hospital encounter of 09/21/21  Hemoglobin A1c  Result Value Ref Range   Hgb A1c MFr Bld 5.7 (H) 4.8 - 5.6 %   Mean Plasma Glucose 116.89 mg/dL  Lipid panel  Result Value Ref Range   Cholesterol 150 0 - 200 mg/dL   Triglycerides 829 <937 mg/dL   HDL 49 >16 mg/dL   Total CHOL/HDL Ratio 3.1 RATIO   VLDL 20 0 - 40 mg/dL   LDL Cholesterol 81 0 - 99 mg/dL  Comprehensive metabolic panel  Result Value Ref Range   Sodium 140 135 - 145 mmol/L   Potassium 3.7 3.5 - 5.1 mmol/L   Chloride 107 98 - 111 mmol/L   CO2 26 22 - 32 mmol/L   Glucose, Bld 116 (H) 70 - 99 mg/dL   BUN 8 6 - 20 mg/dL   Creatinine, Ser 9.67 0.44 - 1.00 mg/dL   Calcium 9.3 8.9 - 89.3 mg/dL   Total Protein 7.1 6.5 - 8.1 g/dL   Albumin 3.9 3.5 - 5.0 g/dL   AST 19 15 - 41 U/L   ALT 23 0 - 44 U/L   Alkaline Phosphatase 80 38 - 126 U/L   Total Bilirubin 0.6 0.3 - 1.2 mg/dL   GFR, Estimated >81 >01 mL/min   Anion gap 7 5 -  15      Assessment & Plan:    Encounter Diagnoses  Name Primary?   Hyperlipidemia, unspecified hyperlipidemia type Yes   Prediabetes     -reviewed labs with pt -pt to continue atorvastatin and healthy diet and exercise -discussed mammogram due in early august.  Discussed that she can stop by office to sign mammography scholarship fund application at her convenience.  After the form is signed, her mammogram will be ordered -she has appointment to update enrollment with [facility] -pt to follow up in office in 3 months.  She is to contact office sooner prn

## 2021-10-10 ENCOUNTER — Other Ambulatory Visit: Payer: Self-pay | Admitting: Physician Assistant

## 2021-10-18 NOTE — Congregational Nurse Program (Signed)
Client didn't discuss any insecurities on food or losing housing, but she did express concern for utilities, no insecurities about feeling unsafe or difficulties to medical appointment due to transportation. I connected her with utilities resource, and check on her wellbeing.

## 2021-12-20 ENCOUNTER — Other Ambulatory Visit: Payer: Self-pay | Admitting: Physician Assistant

## 2021-12-20 DIAGNOSIS — E785 Hyperlipidemia, unspecified: Secondary | ICD-10-CM

## 2021-12-28 ENCOUNTER — Other Ambulatory Visit (HOSPITAL_COMMUNITY)
Admission: RE | Admit: 2021-12-28 | Discharge: 2021-12-28 | Disposition: A | Discharge status: Home / Self Care | Payer: Self-pay | Source: Ambulatory Visit | Attending: Physician Assistant | Admitting: Physician Assistant

## 2021-12-28 DIAGNOSIS — E785 Hyperlipidemia, unspecified: Secondary | ICD-10-CM | POA: Insufficient documentation

## 2021-12-28 LAB — LIPID PANEL
Cholesterol: 242 mg/dL — ABNORMAL HIGH (ref 0–200)
HDL: 57 mg/dL (ref >40)
LDL Cholesterol: 168 mg/dL — ABNORMAL HIGH (ref 0–99)
Total CHOL/HDL Ratio: 4.2 RATIO
Triglycerides: 85 mg/dL (ref <150)
VLDL: 17 mg/dL (ref 0–40)

## 2021-12-28 LAB — HEPATIC FUNCTION PANEL
ALT: 27 U/L (ref 0–44)
AST: 22 U/L (ref 15–41)
Albumin: 4 g/dL (ref 3.5–5.0)
Alkaline Phosphatase: 75 U/L (ref 38–126)
Bilirubin, Direct: 0.1 mg/dL (ref 0.0–0.2)
Indirect Bilirubin: 0.8 mg/dL (ref 0.3–0.9)
Total Bilirubin: 0.9 mg/dL (ref 0.3–1.2)
Total Protein: 7.5 g/dL (ref 6.5–8.1)

## 2022-01-02 ENCOUNTER — Ambulatory Visit: Payer: Self-pay | Admitting: Physician Assistant

## 2022-01-02 ENCOUNTER — Other Ambulatory Visit: Payer: Self-pay | Admitting: Physician Assistant

## 2022-01-02 ENCOUNTER — Encounter: Payer: Self-pay | Admitting: Physician Assistant

## 2022-01-02 VITALS — BP 123/77 | HR 71 | Temp 97.3°F | Wt 192.0 lb

## 2022-01-02 DIAGNOSIS — Z1231 Encounter for screening mammogram for malignant neoplasm of breast: Secondary | ICD-10-CM

## 2022-01-02 DIAGNOSIS — E785 Hyperlipidemia, unspecified: Secondary | ICD-10-CM

## 2022-01-02 DIAGNOSIS — E669 Obesity, unspecified: Secondary | ICD-10-CM

## 2022-01-02 DIAGNOSIS — Z1211 Encounter for screening for malignant neoplasm of colon: Secondary | ICD-10-CM

## 2022-01-02 DIAGNOSIS — M79641 Pain in right hand: Secondary | ICD-10-CM

## 2022-01-02 DIAGNOSIS — Z1239 Encounter for other screening for malignant neoplasm of breast: Secondary | ICD-10-CM

## 2022-01-02 DIAGNOSIS — M79674 Pain in right toe(s): Secondary | ICD-10-CM

## 2022-01-02 NOTE — Progress Notes (Signed)
BP 123/77   Pulse 71   Temp (!) 97.3 F (36.3 C)   Wt 192 lb (87.1 kg)   SpO2 95%   BMI 36.88 kg/m    Subjective:    Patient ID: Sonya Gray, female    DOB: 10-27-1970, 51 y.o.   MRN: [id number]  HPI: Sonya Gray is a 51 y.o. female presenting on 01/02/2022 for Hyperlipidemia   HPI  Chief Complaint  Patient presents with   Hyperlipidemia     She was off her lipitor for whole month of September.  She restarted it a few days ago.    She gets cramps in her right hand frequently when writing.  It doesn't bother her at other times.   Her right foot sometimes hurts when she is doing certain exercises.  She exercises with bare feet.  She does activities like lunges.      Relevant past medical, surgical, family and social history reviewed and updated as indicated. Interim medical history since our last visit reviewed. Allergies and medications reviewed and updated.   Current Outpatient Medications:    atorvastatin (LIPITOR) 20 MG tablet, TAKE 1 Tablet BY MOUTH ONCE EVERY DAY, Disp: 90 tablet, Rfl: 1   Review of Systems  Per HPI unless specifically indicated above     Objective:    BP 123/77   Pulse 71   Temp (!) 97.3 F (36.3 C)   Wt 192 lb (87.1 kg)   SpO2 95%   BMI 36.88 kg/m   Wt Readings from Last 3 Encounters:  01/02/22 192 lb (87.1 kg)  05/26/21 195 lb (88.5 kg)  10/26/20 209 lb (94.8 kg)    Physical Exam Vitals reviewed.  Constitutional:      General: She is not in acute distress.    Appearance: She is well-developed. She is obese. She is not toxic-appearing.  HENT:     Head: Normocephalic and atraumatic.     Right Ear: Tympanic membrane, ear canal and external ear normal.     Left Ear: Tympanic membrane, ear canal and external ear normal.  Eyes:     Extraocular Movements: Extraocular movements intact.     Conjunctiva/sclera: Conjunctivae normal.     Pupils: Pupils are equal, round, and reactive to light.  Cardiovascular:     Rate and  Rhythm: Normal rate and regular rhythm.  Pulmonary:     Effort: Pulmonary effort is normal.     Breath sounds: Normal breath sounds.  Abdominal:     General: Bowel sounds are normal.     Palpations: Abdomen is soft. There is no mass.     Tenderness: There is no abdominal tenderness.  Musculoskeletal:     Right hand: Normal. No swelling, deformity, tenderness or bony tenderness. Normal range of motion. Normal strength. Normal capillary refill. Normal pulse.     Cervical back: Neck supple.     Right lower leg: No edema.     Left lower leg: No edema.     Right foot: Normal. Normal range of motion and normal capillary refill. No swelling, deformity, bunion, tenderness or bony tenderness. Normal pulse.  Lymphadenopathy:     Cervical: No cervical adenopathy.  Skin:    General: Skin is warm and dry.  Neurological:     Mental Status: She is alert and oriented to person, place, and time.     Motor: No weakness or tremor.     Gait: Gait is intact. Gait normal.     Deep Tendon Reflexes:  Reflex Scores:      Patellar reflexes are 2+ on the right side and 2+ on the left side. Psychiatric:        Attention and Perception: Attention normal.        Mood and Affect: Affect is not inappropriate.        Speech: Speech normal.        Behavior: Behavior normal. Behavior is cooperative.     Comments: Pleasant and engaged     Results for orders placed or performed during the hospital encounter of 12/28/21  Hepatic function panel  Result Value Ref Range   Total Protein 7.5 6.5 - 8.1 g/dL   Albumin 4.0 3.5 - 5.0 g/dL   AST 22 15 - 41 U/L   ALT 27 0 - 44 U/L   Alkaline Phosphatase 75 38 - 126 U/L   Total Bilirubin 0.9 0.3 - 1.2 mg/dL   Bilirubin, Direct 0.1 0.0 - 0.2 mg/dL   Indirect Bilirubin 0.8 0.3 - 0.9 mg/dL  Lipid panel  Result Value Ref Range   Cholesterol 242 (H) 0 - 200 mg/dL   Triglycerides 85 <829 mg/dL   HDL 57 >93 mg/dL   Total CHOL/HDL Ratio 4.2 RATIO   VLDL 17 0 - 40 mg/dL    LDL Cholesterol 716 (H) 0 - 99 mg/dL      Assessment & Plan:    Encounter Diagnoses  Name Primary?   Hyperlipidemia, unspecified hyperlipidemia type Yes   Obesity, unspecified classification, unspecified obesity type, unspecified whether serious comorbidity present    Screening for colon cancer    Encounter for screening for malignant neoplasm of breast, unspecified screening modality    Pain of toe of right foot    Pain of right hand      -reviewed labs with pt -pt was encouraged to Take atorvastatin regularly as prescribed and follow lowfat diet -pt was encouraged to try exercising with her shoes on to try to alleviate toe pain -pt was encouraged to massage the hand if it starts cramping when she is writing.  She is to contact office if she starts having problems with the hand at other times/doing other activities -refer for screening Mammogram -pt was given FIT test for colon cancer screening -pt to follow up 3 months to recheck lipids.  She is to contact office sooner prn

## 2022-01-05 LAB — POC FIT TEST STOOL: Fecal Occult Blood: NEGATIVE

## 2022-01-20 ENCOUNTER — Ambulatory Visit (HOSPITAL_COMMUNITY)
Admission: RE | Admit: 2022-01-20 | Discharge: 2022-01-20 | Disposition: A | Discharge status: Home / Self Care | Payer: Medicaid Other | Source: Ambulatory Visit | Attending: Physician Assistant | Admitting: Physician Assistant

## 2022-01-20 DIAGNOSIS — Z1231 Encounter for screening mammogram for malignant neoplasm of breast: Secondary | ICD-10-CM | POA: Insufficient documentation

## 2022-03-27 ENCOUNTER — Ambulatory Visit: Payer: Self-pay | Admitting: Physician Assistant

## 2022-05-02 ENCOUNTER — Other Ambulatory Visit: Payer: Self-pay | Admitting: Physician Assistant

## 2022-05-02 MED ORDER — ATORVASTATIN CALCIUM 20 MG PO TABS: ORAL_TABLET | ORAL | 1 refills | Status: DC

## 2022-07-04 ENCOUNTER — Other Ambulatory Visit: Payer: Self-pay | Admitting: Physician Assistant

## 2022-08-02 ENCOUNTER — Encounter: Payer: Self-pay | Admitting: *Deleted

## 2022-08-02 ENCOUNTER — Ambulatory Visit: Payer: Medicaid Other | Admitting: Orthopedic Surgery

## 2022-08-03 ENCOUNTER — Other Ambulatory Visit (INDEPENDENT_AMBULATORY_CARE_PROVIDER_SITE_OTHER): Payer: Medicaid Other

## 2022-08-03 ENCOUNTER — Encounter: Payer: Self-pay | Admitting: Orthopaedic Surgery

## 2022-08-03 ENCOUNTER — Ambulatory Visit (INDEPENDENT_AMBULATORY_CARE_PROVIDER_SITE_OTHER): Payer: Medicaid Other | Admitting: Orthopaedic Surgery

## 2022-08-03 VITALS — Ht 60.0 in | Wt 200.0 lb

## 2022-08-03 DIAGNOSIS — M542 Cervicalgia: Secondary | ICD-10-CM

## 2022-08-03 DIAGNOSIS — M545 Low back pain, unspecified: Secondary | ICD-10-CM

## 2022-08-03 DIAGNOSIS — G8929 Other chronic pain: Secondary | ICD-10-CM

## 2022-08-03 NOTE — Addendum Note (Signed)
Addended by: Rogers Seeds on: 08/03/2022 04:34 PM   Modules accepted: Orders

## 2022-08-03 NOTE — Progress Notes (Signed)
Office Visit Note   Patient: Sonya Gray           Date of Birth: 10-Aug-1970           MRN: [id number] Visit Date: 08/03/2022              Requested by: Wilmon Pali, FNP [address] [place],  [place] [address] PCP: Wilmon Pali, FNP   Assessment & Plan: Visit Diagnoses:  1. Neck pain   2. Chronic bilateral low back pain, unspecified whether sciatica present     Plan: Will proceed with some physical therapy to work on treatment for cervical spondylosis and lumbar spine office follow-up 7 weeks.  Follow-Up Instructions: No follow-ups on file.   Orders:  Orders Placed This Encounter  Procedures   XR Cervical Spine 2 or 3 views   XR Lumbar Spine 2-3 Views   No orders of the defined types were placed in this encounter.     Procedures: No procedures performed   Clinical Data: No additional findings.   Subjective: Chief Complaint  Patient presents with   Neck - Pain   Lower Back - Pain    HPI 52 year old female seen with neck and back pain.  She was in [place] moved here 2 years ago she did have a history of sciatica with lumbar pinched nerve by MRI.  Pain radiates from her back into her right leg down to her knee.  Her neck actually gives her more problems with pain radiating into her shoulders with occasional numbness into her hand in the C6 distribution.  No previous neck or back surgeries.  She had responded to therapy as well as muscle relaxants when she was in [place].  Currently she is on ibuprofen.  Review of Systems all other systems noncontributory no chills or fever no associated bowel or bladder symptoms.   Objective: Vital Signs: Ht 5' (1.524 m)   Wt 200 lb (90.7 kg)   BMI 39.06 kg/m   Physical Exam Constitutional:      Appearance: She is well-developed.  HENT:     Head: Normocephalic.     Right Ear: External ear normal.     Left Ear: External ear normal. There is no impacted cerumen.  Eyes:     Pupils: Pupils are equal,  round, and reactive to light.  Neck:     Thyroid: No thyromegaly.     Trachea: No tracheal deviation.  Cardiovascular:     Rate and Rhythm: Normal rate.  Pulmonary:     Effort: Pulmonary effort is normal.  Abdominal:     Palpations: Abdomen is soft.  Musculoskeletal:     Cervical back: No rigidity.  Skin:    General: Skin is warm and dry.  Neurological:     Mental Status: She is alert and oriented to person, place, and time.  Psychiatric:        Behavior: Behavior normal.     Ortho Exam some brachial plexus tenderness normal heel-toe gait negative straight leg raising 90 degrees.  Minimal trochanteric bursal tenderness.  Pelvis is level.  No limitation internal rotation right or left hip knees reach full extension anterior tib gastrocsoleus is strong and symmetrical.  No clonus.  Specialty Comments:  No specialty comments available.  Imaging: No results found.   PMFS History: There are no problems to display for this patient.  No past medical history on file.  Family History  Problem Relation Age of Onset   Heart  disease Mother    Heart failure Mother    Cancer Father    Cancer Sister     Past Surgical History:  Procedure Laterality Date   TUBAL LIGATION     Social History   Occupational History   Not on file  Tobacco Use   Smoking status: Never   Smokeless tobacco: Never  Vaping Use   Vaping Use: Never used  Substance and Sexual Activity   Alcohol use: Yes    Comment: rare   Drug use: Never   Sexual activity: Not on file

## 2022-08-10 ENCOUNTER — Other Ambulatory Visit (HOSPITAL_COMMUNITY): Payer: Self-pay | Admitting: Family Medicine

## 2022-08-10 DIAGNOSIS — Z1231 Encounter for screening mammogram for malignant neoplasm of breast: Secondary | ICD-10-CM

## 2022-08-21 ENCOUNTER — Telehealth: Payer: Self-pay | Admitting: *Deleted

## 2022-08-21 NOTE — Telephone Encounter (Signed)
  Procedure: Colonoscopy  Estimated body mass index is 39.06 kg/m as calculated from the following:   Height as of 08/03/22: 5' (1.524 m).   Weight as of 08/03/22: 200 lb (90.7 kg).   Have you had a colonoscopy before?  no  Do you have family history of colon cancer?  no  Do you have a family history of polyps? no  Previous colonoscopy with polyps removed? no  Do you have a history colorectal cancer?   no  Are you diabetic?  no  Do you have a prosthetic or mechanical heart valve? no  Do you have a pacemaker/defibrillator?   no  Have you had endocarditis/atrial fibrillation?  no  Do you use supplemental oxygen/CPAP?  no  Have you had joint replacement within the last 12 months?  no  Do you tend to be constipated or have to use laxatives?  no   Do you have history of alcohol use? If yes, how much and how often.  no  Do you have history or are you using drugs? If yes, what do are you  using?  no  Have you ever had a stroke/heart attack?  no  Have you ever had a heart or other vascular stent placed,?no  Do you take weight loss medication? no  female patients,: have you had a hysterectomy? no                              are you post menopausal?  yes                              do you still have your menstrual cycle? no    Date of last menstrual period? no  Do you take any blood-thinning medications such as: (Plavix, aspirin, Coumadin, Aggrenox, Brilinta, Xarelto, Eliquis, Pradaxa, Savaysa or Effient)? no  If yes we need the name, milligram, dosage and who is prescribing doctor:               Takes no medications   Allergies  Allergen Reactions   Cepacol [Benzocaine-Menthol] Hives   Compazine [Prochlorperazine]

## 2022-08-22 NOTE — Telephone Encounter (Signed)
OK to schedule. ASA 2.  ?

## 2022-08-23 NOTE — Telephone Encounter (Signed)
LMOVM

## 2022-08-24 NOTE — Telephone Encounter (Signed)
Pt has been scheduled with Dr.carver on 09/29/22. Instructions mailed and prep sent to the pharmacy.

## 2022-08-24 NOTE — Telephone Encounter (Signed)
Pt left vm returning call  LMTRC 

## 2022-08-25 ENCOUNTER — Encounter: Payer: Self-pay | Admitting: *Deleted

## 2022-08-25 NOTE — Telephone Encounter (Signed)
Referral completed

## 2022-09-21 ENCOUNTER — Encounter: Payer: Self-pay | Admitting: *Deleted

## 2022-09-21 MED ORDER — CLENPIQ 10-3.5-12 MG-GM -GM/175ML PO SOLN: 1.0000 | ORAL | 0 refills | Status: DC

## 2022-09-21 NOTE — Addendum Note (Signed)
Addended by: Armstead Peaks on: 09/21/2022 12:21 PM   Modules accepted: Orders

## 2022-09-21 NOTE — Telephone Encounter (Signed)
Pt called in. She hasn't received instructions and prep was not sent to pharmacy. Advised pt will send her instructions to her mychart and will send rx to her pharmacy.   PA submitted VIA [facility]. PA pending. The prior authorization/notification reference number is: [id number].

## 2022-09-28 ENCOUNTER — Ambulatory Visit (INDEPENDENT_AMBULATORY_CARE_PROVIDER_SITE_OTHER): Payer: Medicaid Other | Admitting: Orthopaedic Surgery

## 2022-09-28 ENCOUNTER — Encounter: Payer: Self-pay | Admitting: Orthopaedic Surgery

## 2022-09-28 VITALS — Ht 60.0 in | Wt 195.0 lb

## 2022-09-28 DIAGNOSIS — M545 Low back pain, unspecified: Secondary | ICD-10-CM | POA: Diagnosis not present

## 2022-09-28 DIAGNOSIS — M542 Cervicalgia: Secondary | ICD-10-CM | POA: Diagnosis not present

## 2022-09-28 NOTE — Progress Notes (Signed)
Office Visit Note   Patient: Sonya Gray           Date of Birth: 12-Nov-1970           MRN: [id number] Visit Date: 09/28/2022              Requested by: Wilmon Pali, FNP [address] [place],  [place] [address] PCP: Wilmon Pali, FNP   Assessment & Plan: Visit Diagnoses:  1. Neck pain   2. Low back pain without sciatica, unspecified back pain laterality, unspecified chronicity     Plan: She can complete her therapy follow-up with me in 5 weeks.  Follow-Up Instructions: Return in about 5 weeks (around 11/02/2022).   Orders:  No orders of the defined types were placed in this encounter.  No orders of the defined types were placed in this encounter.     Procedures: No procedures performed   Clinical Data: No additional findings.   Subjective: Chief Complaint  Patient presents with   Neck - Follow-up   Lower Back - Follow-up    HPI follow-up neck and back pain and therapy.  She missed some appointments due to a funeral.  She still has few more appointments scheduled she said she thinks 4 so far.  She has gotten some improvement more in the neck than the back.  No bowel bladder symptoms no fever or chills.  Mobility is slightly improved.  Review of Systems updated unchanged   Objective: Vital Signs: Ht 5' (1.524 m)   Wt 195 lb (88.5 kg)   BMI 38.08 kg/m   Physical Exam Constitutional:      Appearance: She is well-developed.  HENT:     Head: Normocephalic.     Right Ear: External ear normal.     Left Ear: External ear normal. There is no impacted cerumen.  Eyes:     Pupils: Pupils are equal, round, and reactive to light.  Neck:     Thyroid: No thyromegaly.     Trachea: No tracheal deviation.  Cardiovascular:     Rate and Rhythm: Normal rate.  Pulmonary:     Effort: Pulmonary effort is normal.  Abdominal:     Palpations: Abdomen is soft.  Musculoskeletal:     Cervical back: No rigidity.  Skin:    General: Skin is warm and dry.  Neurological:      Mental Status: She is alert and oriented to person, place, and time.  Psychiatric:        Behavior: Behavior normal.     Ortho Exam minimal brachial plexus tenderness negative Spurling no rash or exposed skin normal heel-toe gait.  Mild trochanteric bursal tenderness.  No lower extremity clonus.  Specialty Comments:  No specialty comments available.  Imaging: No results found.   PMFS History: Patient Active Problem List   Diagnosis Date Noted   Neck pain 09/28/2022   Low back pain 09/28/2022   No past medical history on file.  Family History  Problem Relation Age of Onset   Heart disease Mother    Heart failure Mother    Cancer Father    Cancer Sister     Past Surgical History:  Procedure Laterality Date   TUBAL LIGATION     Social History   Occupational History   Not on file  Tobacco Use   Smoking status: Never   Smokeless tobacco: Never  Vaping Use   Vaping Use: Never used  Substance and Sexual Activity   Alcohol use:  Yes    Comment: rare   Drug use: Never   Sexual activity: Not on file

## 2022-09-29 ENCOUNTER — Telehealth: Payer: Self-pay | Admitting: Internal Medicine

## 2022-09-29 ENCOUNTER — Other Ambulatory Visit: Payer: Self-pay

## 2022-09-29 ENCOUNTER — Ambulatory Visit (HOSPITAL_COMMUNITY): Payer: Medicaid Other | Admitting: Anesthesiology

## 2022-09-29 ENCOUNTER — Ambulatory Visit (HOSPITAL_COMMUNITY)
Admission: RE | Admit: 2022-09-29 | Discharge: 2022-09-29 | Disposition: A | Discharge status: Home / Self Care | Payer: Medicaid Other | Attending: Internal Medicine | Admitting: Internal Medicine

## 2022-09-29 ENCOUNTER — Encounter (HOSPITAL_COMMUNITY)
Admission: RE | Disposition: A | Discharge status: Home / Self Care | Payer: Self-pay | Source: Home / Self Care | Attending: Internal Medicine

## 2022-09-29 ENCOUNTER — Encounter (HOSPITAL_COMMUNITY): Payer: Self-pay

## 2022-09-29 DIAGNOSIS — K648 Other hemorrhoids: Secondary | ICD-10-CM | POA: Diagnosis not present

## 2022-09-29 DIAGNOSIS — Z1211 Encounter for screening for malignant neoplasm of colon: Secondary | ICD-10-CM | POA: Insufficient documentation

## 2022-09-29 DIAGNOSIS — K5730 Diverticulosis of large intestine without perforation or abscess without bleeding: Secondary | ICD-10-CM | POA: Insufficient documentation

## 2022-09-29 DIAGNOSIS — R001 Bradycardia, unspecified: Secondary | ICD-10-CM

## 2022-09-29 HISTORY — PX: COLONOSCOPY WITH PROPOFOL: SHX5780

## 2022-09-29 SURGERY — COLONOSCOPY WITH PROPOFOL
Anesthesia: General

## 2022-09-29 MED ORDER — GLYCOPYRROLATE PF 0.2 MG/ML IJ SOSY
PREFILLED_SYRINGE | INTRAMUSCULAR | Status: AC
  Filled 2022-09-29: qty 1

## 2022-09-29 MED ORDER — LIDOCAINE HCL (PF) 2 % IJ SOLN
INTRAMUSCULAR | Status: AC
  Filled 2022-09-29: qty 5

## 2022-09-29 MED ORDER — PROPOFOL 10 MG/ML IV BOLUS
INTRAVENOUS | Status: DC | PRN
  Administered 2022-09-29: 100 mg via INTRAVENOUS

## 2022-09-29 MED ORDER — LACTATED RINGERS IV SOLN: INTRAVENOUS | Status: DC

## 2022-09-29 MED ORDER — PROPOFOL 500 MG/50ML IV EMUL
INTRAVENOUS | Status: DC | PRN
  Administered 2022-09-29: 150 ug/kg/min via INTRAVENOUS

## 2022-09-29 MED ORDER — GLYCOPYRROLATE PF 0.2 MG/ML IJ SOSY
PREFILLED_SYRINGE | INTRAMUSCULAR | Status: DC | PRN
  Administered 2022-09-29 (×2): .1 mg via INTRAVENOUS

## 2022-09-29 MED ORDER — LIDOCAINE HCL (CARDIAC) PF 100 MG/5ML IV SOSY
PREFILLED_SYRINGE | INTRAVENOUS | Status: DC | PRN
  Administered 2022-09-29: 80 mg via INTRAVENOUS

## 2022-09-29 NOTE — Telephone Encounter (Signed)
Just completed her colonoscopy.  Her colonoscopy was normal, however during the procedure she had 3 rounds of significant bradycardia.  Though she may have had a vagal response related to the procedure itself, these runs of bradycardia did not occur while pressure was being applied with the colonoscope.  Can we refer her to cardiology for evaluation?

## 2022-09-29 NOTE — H&P (Signed)
Primary Care Physician:  Wilmon Pali, FNP Primary Gastroenterologist:  Dr. Marletta Lor  Pre-Procedure History & Physical: HPI:  Sonya Gray is a 52 y.o. female is here for first ever colonoscopy for colon cancer screening purposes.  Patient denies any family history of colorectal cancer.  No melena or hematochezia.  No abdominal pain or unintentional weight loss.  No change in bowel habits.  Overall feels well from a GI standpoint.  History reviewed. No pertinent past medical history.  Past Surgical History:  Procedure Laterality Date   TUBAL LIGATION      Prior to Admission medications   Medication Sig Start Date End Date Taking? Authorizing Provider  Sod Picosulfate-Mag Ox-Cit Acd (CLENPIQ) 10-3.5-12 MG-GM -GM/175ML SOLN Take 1 kit by mouth as directed. 09/21/22  Yes Lanelle Bal, DO    Allergies as of 08/24/2022 - Review Complete 08/03/2022  Allergen Reaction Noted   Cepacol [benzocaine-menthol] Hives 04/14/2020   Compazine [prochlorperazine]  04/14/2020    Family History  Problem Relation Age of Onset   Heart disease Mother    Heart failure Mother    Cancer Father    Cancer Sister     Social History   Socioeconomic History   Marital status: Single    Spouse name: Not on file   Number of children: Not on file   Years of education: Not on file   Highest education level: Not on file  Occupational History   Not on file  Tobacco Use   Smoking status: Never   Smokeless tobacco: Never  Vaping Use   Vaping Use: Never used  Substance and Sexual Activity   Alcohol use: Yes    Comment: rare   Drug use: Never   Sexual activity: Not on file  Other Topics Concern   Not on file  Social History Narrative   Not on file   Social Determinants of Health   Financial Resource Strain: Not on file  Food Insecurity: Not on file  Transportation Needs: Not on file  Physical Activity: Not on file  Stress: Not on file  Social Connections: Not on file  Intimate Partner  Violence: Not on file    Review of Systems: See HPI, otherwise negative ROS  Physical Exam: Vital signs in last 24 hours: Temp:  [98.4 F (36.9 C)] 98.4 F (36.9 C) (06/21 0955) Pulse Rate:  [76] 76 (06/21 0955) Resp:  [9] 9 (06/21 0955) BP: (134)/(64) 134/64 (06/21 0955) SpO2:  [99 %] 99 % (06/21 0955)   General:   Alert,  Well-developed, well-nourished, pleasant and cooperative in NAD Head:  Normocephalic and atraumatic. Eyes:  Sclera clear, no icterus.   Conjunctiva pink. Ears:  Normal auditory acuity. Nose:  No deformity, discharge,  or lesions. Msk:  Symmetrical without gross deformities. Normal posture. Extremities:  Without clubbing or edema. Neurologic:  Alert and  oriented x4;  grossly normal neurologically. Skin:  Intact without significant lesions or rashes. Psych:  Alert and cooperative. Normal mood and affect.  Impression/Plan: Sonya Gray is here for a colonoscopy to be performed for colon cancer screening purposes.  The risks of the procedure including infection, bleed, or perforation as well as benefits, limitations, alternatives and imponderables have been reviewed with the patient. Questions have been answered. All parties agreeable.

## 2022-09-29 NOTE — Transfer of Care (Addendum)
Immediate Anesthesia Transfer of Care Note  Patient: Sonya Gray  Procedure(s) Performed: COLONOSCOPY WITH PROPOFOL  Patient Location: PACU  Anesthesia Type:General  Level of Consciousness: drowsy and patient cooperative  Airway & Oxygen Therapy: Patient Spontanous Breathing  Post-op Assessment: Report given to RN and Post -op Vital signs reviewed and stable  Post vital signs: Reviewed and stable  Last Vitals:  Vitals Value Taken Time  BP 92/54 09/29/22   1124  Temp 36.8 09/29/22   1121  Pulse 77 09/29/22   1121  Resp 18 09/29/22   1121  SpO2 100% 09/29/22   1121    Last Pain:  Vitals:   09/29/22 1101  TempSrc:   PainSc: 0-No pain      Patients Stated Pain Goal: 5 (09/29/22 0955)  Complications: No notable events documented.

## 2022-09-29 NOTE — Op Note (Signed)
[facility] Patient Name: Sonya Gray Procedure Date: 09/29/2022 10:54 AM MRN: [id number] Date of Birth: [birth year] Attending MD: Hennie Duos. Marletta Lor , [place], [id number] CSN: [id number] Age: 52 Admit Type: Outpatient Procedure:                Colonoscopy Indications:              Screening for colorectal malignant neoplasm Providers:                Hennie Duos. Marletta Lor, DO, Crystal Page Referring MD:              Medicines:                See the Anesthesia note for documentation of the                            administered medications Complications:            No immediate complications. Estimated Blood Loss:     Estimated blood loss: none. Procedure:                Pre-Anesthesia Assessment:                           - The anesthesia plan was to use monitored                            anesthesia care (MAC).                           After obtaining informed consent, the colonoscope                            was passed under direct vision. Throughout the                            procedure, the patient's blood pressure, pulse, and                            oxygen saturations were monitored continuously. The                            [id number]-HQ190L ([id number]) scope was introduced through                            the anus and advanced to the the cecum, identified                            by appendiceal orifice and ileocecal valve. The                            colonoscopy was performed without difficulty. The                            patient tolerated the procedure well. The quality                            of the bowel  preparation was evaluated using the                            BBPS [facility] Bowel Preparation Scale) with scores                            of: Right Colon = 3, Transverse Colon = 3 and Left                            Colon = 3 (entire mucosa seen well with no residual                            staining, small fragments of stool or opaque                             liquid). The total BBPS score equals 9. Scope In: 11:05:33 AM Scope Out: 11:15:33 AM Scope Withdrawal Time: 0 hours 6 minutes 46 seconds  Total Procedure Duration: 0 hours 10 minutes 0 seconds  Findings:      Non-bleeding internal hemorrhoids were found during endoscopy.      Multiple medium-mouthed and small-mouthed diverticula were found in the       sigmoid colon and descending colon.      The exam was otherwise without abnormality. Impression:               - Non-bleeding internal hemorrhoids.                           - Diverticulosis in the sigmoid colon and in the                            descending colon.                           - The examination was otherwise normal.                           - No specimens collected. Moderate Sedation:      Per Anesthesia Care Recommendation:           - Patient has a contact number available for                            emergencies. The signs and symptoms of potential                            delayed complications were discussed with the                            patient. Return to normal activities tomorrow.                            Written discharge instructions were provided to the                            patient.                           -  Resume previous diet.                           - Continue present medications.                           - Repeat colonoscopy in 10 years for screening                            purposes.                           - Return to GI clinic PRN.                           - During her procedure, patient had a few runs of                            significant bradycardia in the 30s. While                            withdrawing the scope in the sigmoid colon she had                            a short run down to 10's which only lasted 1 to 2                            seconds and rebounded quickly. Though vagal                            response could have contributed, minimal  pressure                            was being applied at these times. Would recommend                            cardiology evaluation. Will make referral today. Procedure Code(s):        --- Professional ---                           W0981, Colorectal cancer screening; colonoscopy on                            individual not meeting criteria for high risk Diagnosis Code(s):        --- Professional ---                           Z12.11, Encounter for screening for malignant                            neoplasm of colon                           K64.8, Other hemorrhoids  K57.30, Diverticulosis of large intestine without                            perforation or abscess without bleeding CPT copyright 2022 American [employer] Association. All rights reserved. The codes documented in this report are preliminary and upon coder review may  be revised to meet current compliance requirements. Hennie Duos. Marletta Lor, DO Hennie Duos. Marletta Lor, DO 09/29/2022 11:20:58 AM This report has been signed electronically. Number of Addenda: 0

## 2022-09-29 NOTE — Anesthesia Preprocedure Evaluation (Signed)
Anesthesia Evaluation  Patient identified by MRN, date of birth, ID band Patient awake    Reviewed: Allergy & Precautions, H&P , NPO status , Patient's Chart, lab work & pertinent test results  Airway Mallampati: II  TM Distance: >3 FB Neck ROM: Full    Dental  (+) Missing, Chipped, Dental Advisory Given   Pulmonary neg pulmonary ROS   Pulmonary exam normal breath sounds clear to auscultation       Cardiovascular negative cardio ROS Normal cardiovascular exam Rhythm:Regular Rate:Normal     Neuro/Psych negative neurological ROS  negative psych ROS   GI/Hepatic negative GI ROS, Neg liver ROS,,,  Endo/Other  negative endocrine ROS    Renal/GU negative Renal ROS  negative genitourinary   Musculoskeletal negative musculoskeletal ROS (+)    Abdominal   Peds negative pediatric ROS (+)  Hematology negative hematology ROS (+)   Anesthesia Other Findings   Reproductive/Obstetrics negative OB ROS                             Anesthesia Physical Anesthesia Plan  ASA: 2  Anesthesia Plan: General   Post-op Pain Management: Minimal or no pain anticipated   Induction: Intravenous  PONV Risk Score and Plan: 1 and Propofol infusion  Airway Management Planned: Nasal Cannula and Natural Airway  Additional Equipment:   Intra-op Plan:   Post-operative Plan:   Informed Consent: I have reviewed the patients History and Physical, chart, labs and discussed the procedure including the risks, benefits and alternatives for the proposed anesthesia with the patient or authorized representative who has indicated his/her understanding and acceptance.     Dental advisory given  Plan Discussed with: CRNA and Surgeon  Anesthesia Plan Comments:        Anesthesia Quick Evaluation

## 2022-09-29 NOTE — Anesthesia Postprocedure Evaluation (Signed)
Anesthesia Post Note  Patient: Sonya Gray  Procedure(s) Performed: COLONOSCOPY WITH PROPOFOL  Patient location during evaluation: Phase II Anesthesia Type: General Level of consciousness: awake and alert and oriented Pain management: pain level controlled Vital Signs Assessment: post-procedure vital signs reviewed and stable Respiratory status: spontaneous breathing, nonlabored ventilation and respiratory function stable Cardiovascular status: blood pressure returned to baseline and stable Postop Assessment: no apparent nausea or vomiting Anesthetic complications: no  No notable events documented.   Last Vitals:  Vitals:   09/29/22 1121 09/29/22 1124  BP: (!) 87/54 (!) 92/54  Pulse: 76 73  Resp:  18  Temp: 36.8 C   SpO2: 100% 98%    Last Pain:  Vitals:   09/29/22 1124  TempSrc:   PainSc: 0-No pain                 Saunders [place] C Felecia Stanfill

## 2022-09-29 NOTE — Discharge Instructions (Addendum)
  Colonoscopy Discharge Instructions  Read the instructions outlined below and refer to this sheet in the next few weeks. These discharge instructions provide you with general information on caring for yourself after you leave the hospital. Your doctor may also give you specific instructions. While your treatment has been planned according to the most current medical practices available, unavoidable complications occasionally occur.   ACTIVITY You may resume your regular activity, but move at a slower pace for the next 24 hours.  Take frequent rest periods for the next 24 hours.  Walking will help get rid of the air and reduce the bloated feeling in your belly (abdomen).  No driving for 24 hours (because of the medicine (anesthesia) used during the test).   Do not sign any important legal documents or operate any machinery for 24 hours (because of the anesthesia used during the test).  NUTRITION Drink plenty of fluids.  You may resume your normal diet as instructed by your doctor.  Begin with a light meal and progress to your normal diet. Heavy or fried foods are harder to digest and may make you feel sick to your stomach (nauseated).  Avoid alcoholic beverages for 24 hours or as instructed.  MEDICATIONS You may resume your normal medications unless your doctor tells you otherwise.  WHAT YOU CAN EXPECT TODAY Some feelings of bloating in the abdomen.  Passage of more gas than usual.  Spotting of blood in your stool or on the toilet paper.  IF YOU HAD POLYPS REMOVED DURING THE COLONOSCOPY: No aspirin products for 7 days or as instructed.  No alcohol for 7 days or as instructed.  Eat a soft diet for the next 24 hours.  FINDING OUT THE RESULTS OF YOUR TEST Not all test results are available during your visit. If your test results are not back during the visit, make an appointment with your caregiver to find out the results. Do not assume everything is normal if you have not heard from your  caregiver or the medical facility. It is important for you to follow up on all of your test results.  SEEK IMMEDIATE MEDICAL ATTENTION IF: You have more than a spotting of blood in your stool.  Your belly is swollen (abdominal distention).  You are nauseated or vomiting.  You have a temperature over 101.  You have abdominal pain or discomfort that is severe or gets worse throughout the day.   Your colonoscopy was relatively unremarkable.  I did not find any polyps or evidence of colon cancer.  I recommend repeating colonoscopy in 10 years for colon cancer screening purposes.  You do have diverticulosis and internal hemorrhoids. I would recommend increasing fiber in your diet or adding OTC Benefiber/Metamucil. Be sure to drink at least 4 to 6 glasses of water daily. Follow-up with GI as needed.  During your procedure, you did have a few short runs where your heart rate dropped.  Each time the heart rate rebounded very quickly.  This could have been due to a vagal response from the procedure itself though I think you should be evaluated by cardiologist just to be safe.  I will make referral today.  I hope you have a great rest of your week!  Hennie Duos. Marletta Lor, D.O. Gastroenterology and Hepatology [facility]

## 2022-10-02 NOTE — Telephone Encounter (Signed)
Referral sent 

## 2022-10-02 NOTE — Addendum Note (Signed)
Addended by: Armstead Peaks on: 10/02/2022 07:28 AM   Modules accepted: Orders

## 2022-10-05 ENCOUNTER — Encounter (HOSPITAL_COMMUNITY): Payer: Self-pay | Admitting: Internal Medicine

## 2022-11-02 ENCOUNTER — Ambulatory Visit: Payer: Medicaid Other | Admitting: Orthopaedic Surgery

## 2022-11-09 ENCOUNTER — Encounter: Payer: Self-pay | Admitting: Orthopaedic Surgery

## 2022-11-09 ENCOUNTER — Ambulatory Visit (INDEPENDENT_AMBULATORY_CARE_PROVIDER_SITE_OTHER): Payer: Medicaid Other | Admitting: Orthopaedic Surgery

## 2022-11-09 VITALS — Ht 60.0 in | Wt 195.0 lb

## 2022-11-09 DIAGNOSIS — M545 Low back pain, unspecified: Secondary | ICD-10-CM

## 2022-11-09 NOTE — Progress Notes (Signed)
Office Visit Note   Patient: Sonya Gray           Date of Birth: [birth year]           MRN: [id number] Visit Date: 11/09/2022              Requested by: Wilmon Pali, FNP [address] [place],  [place] [address] PCP: Wilmon Pali, FNP   Assessment & Plan: Visit Diagnoses:  1. Low back pain without sciatica, unspecified back pain laterality, unspecified chronicity     Plan: Patient's taken anti-inflammatories and therapy for continued back symptoms.  At this point symptoms are not severe enough to consider diagnostic imaging if she gets progression she will let us know.  No neurogenic claudication.  No nerve root tension signs and no weakness on today's exam.  Plan is to check her in 3 months.  If she gets regression of symptoms then diagnostic MRI scan lumbar spine will be ordered.  Follow-Up Instructions: No follow-ups on file.   Orders:  No orders of the defined types were placed in this encounter.  No orders of the defined types were placed in this encounter.     Procedures: No procedures performed   Clinical Data: No additional findings.   Subjective: Chief Complaint  Patient presents with   Neck - Pain, Follow-up   Lower Back - Pain, Follow-up    HPI 52 year old female returns she states therapy has helped to some degree.  At times she has some funny feeling in her right thigh some pain in her buttocks.  It does not stop her from shopping or other activities.  She is use some ibuprofen 400 mg at times with some relief.  Previous x-rays showed L3-4 disc space narrowing and slight anterolisthesis without pars defect.  She states her neck actually has gotten better and she is happy that her neck does not bother her.  Review of Systems updated unchanged.   Objective: Vital Signs: Ht 5' (1.524 m)   Wt 195 lb (88.5 kg)   BMI 38.08 kg/m   Physical Exam Constitutional:      Appearance: She is well-developed.  HENT:     Head: Normocephalic.     Right Ear:  External ear normal.     Left Ear: External ear normal. There is no impacted cerumen.  Eyes:     Pupils: Pupils are equal, round, and reactive to light.  Neck:     Thyroid: No thyromegaly.     Trachea: No tracheal deviation.  Cardiovascular:     Rate and Rhythm: Normal rate.  Pulmonary:     Effort: Pulmonary effort is normal.  Abdominal:     Palpations: Abdomen is soft.  Musculoskeletal:     Cervical back: No rigidity.  Skin:    General: Skin is warm and dry.  Neurological:     Mental Status: She is alert and oriented to person, place, and time.  Psychiatric:        Behavior: Behavior normal.     Ortho Exam negative logroll hips she is able to heel and toe walk.  Negative Trendelenburg.  Knees reach full extension no atrophy.  Specialty Comments:  No specialty comments available.  Imaging: No results found.   PMFS History: Patient Active Problem List   Diagnosis Date Noted   Neck pain 09/28/2022   Low back pain 09/28/2022   No past medical history on file.  Family History  Problem Relation Age of Onset  Heart disease Mother    Heart failure Mother    Cancer Father    Cancer Sister     Past Surgical History:  Procedure Laterality Date   COLONOSCOPY WITH PROPOFOL N/A 09/29/2022   Procedure: COLONOSCOPY WITH PROPOFOL;  Surgeon: Lanelle Bal, DO;  [facility];  Service: Endoscopy;  Laterality: N/A;  11:15 am, ASA 2   TUBAL LIGATION     Social History   Occupational History   Not on file  Tobacco Use   Smoking status: Never   Smokeless tobacco: Never  Vaping Use   Vaping status: Never Used  Substance and Sexual Activity   Alcohol use: Yes    Comment: rare   Drug use: Never   Sexual activity: Not on file

## 2022-11-21 ENCOUNTER — Telehealth: Payer: Self-pay

## 2022-11-21 DIAGNOSIS — M545 Low back pain, unspecified: Secondary | ICD-10-CM

## 2022-11-21 NOTE — Telephone Encounter (Signed)
Patient called the [facility] office stating she was ready to proceed with MRI scan of her back to be done at [facility].  However, last OV note by Dr Ophelia Charter on 11/09/22 states:  Plan is to check her in 3 months. If she gets regression of symptoms then diagnostic MRI scan lumbar spine will be ordered.   Please advise if ok to proceed with MRI of lumbar spine? Dr Ophelia Charter is out of the office for the rest of the month.

## 2022-11-22 NOTE — Addendum Note (Signed)
Addended by: Barbette Or on: 11/22/2022 10:45 AM   Modules accepted: Orders

## 2022-11-22 NOTE — Telephone Encounter (Signed)
MRI ordered. Called and advised pt

## 2022-11-28 IMAGING — MG MM DIGITAL SCREENING BILAT W/ TOMO AND CAD
6 of 10 series · 6 of 30 positions shown · non-contrast
Comparison: None.

CLINICAL DATA: Screening.

EXAM:
DIGITAL SCREENING BILATERAL MAMMOGRAM WITH TOMOSYNTHESIS AND CAD
TECHNIQUE: Bilateral screening digital craniocaudal and mediolateral oblique
mammograms were obtained. Bilateral screening digital breast
tomosynthesis was performed. The images were evaluated with
computer-aided detection.

[L MLO synth-2D (1 of 2)]
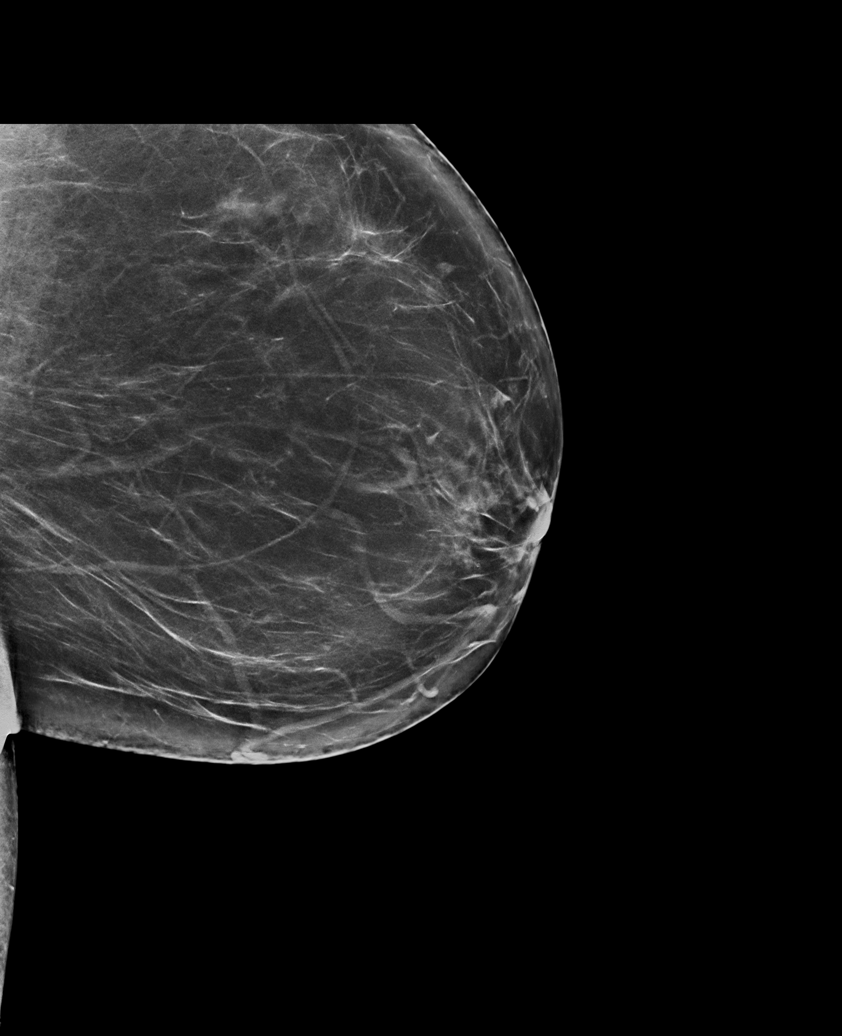

[L MLO synth-2D (2 of 2)]
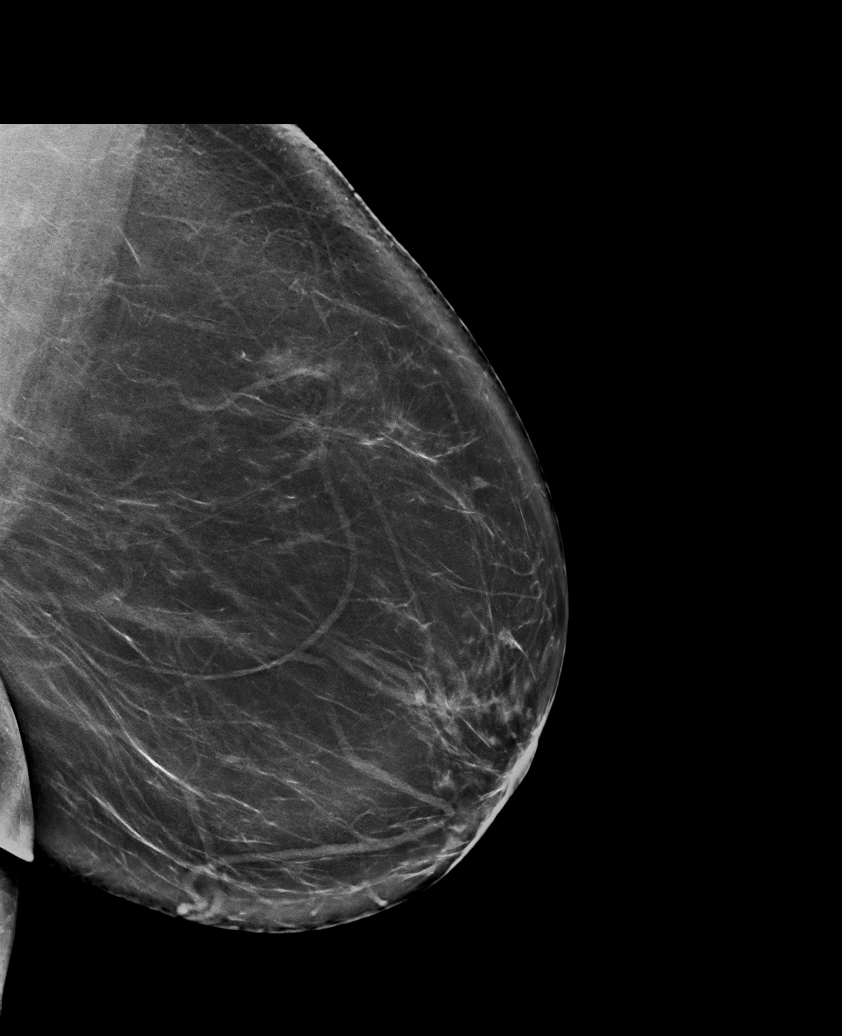

[R CC synth-2D]
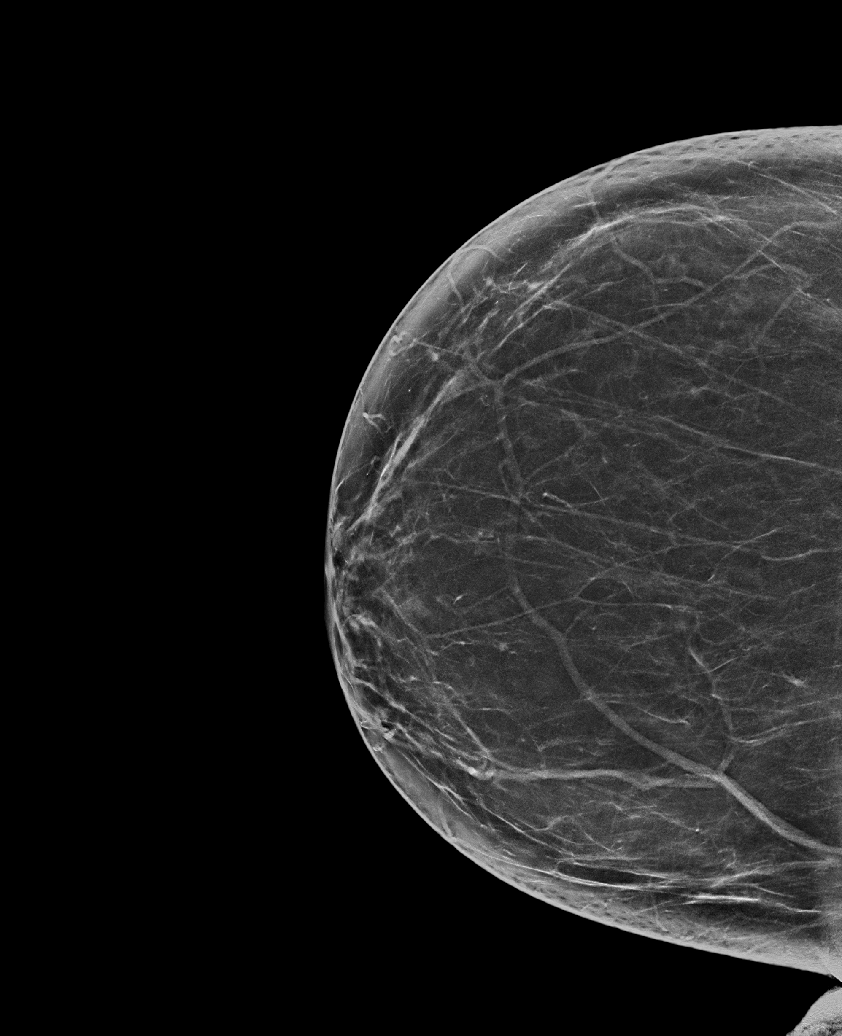

[R MLO synth-2D]
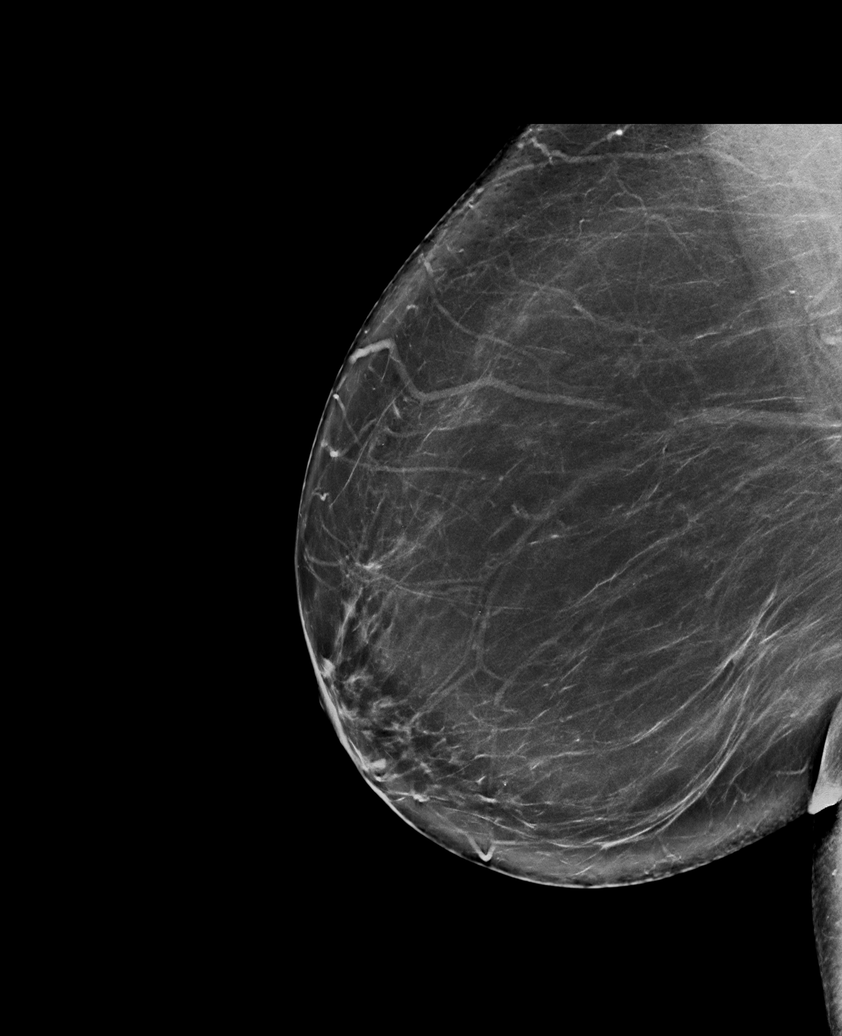

[L CC synth-2D]
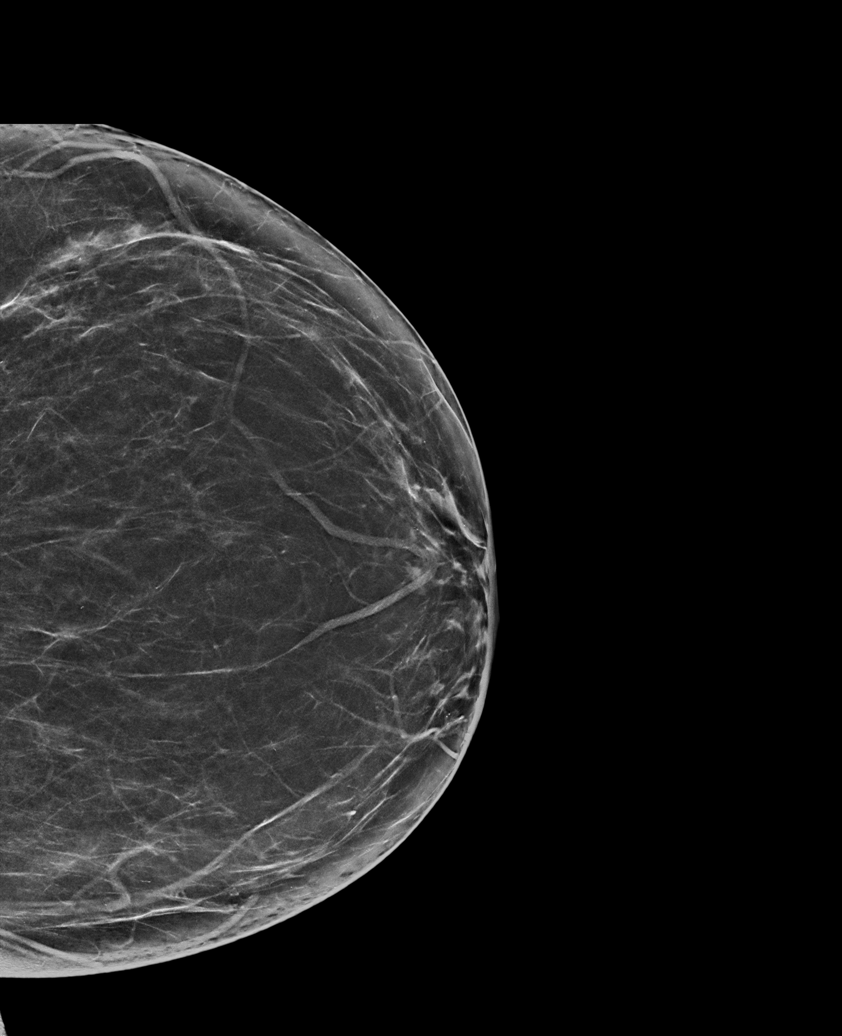

[R MLO tomo · tomo slice 47/92.0]
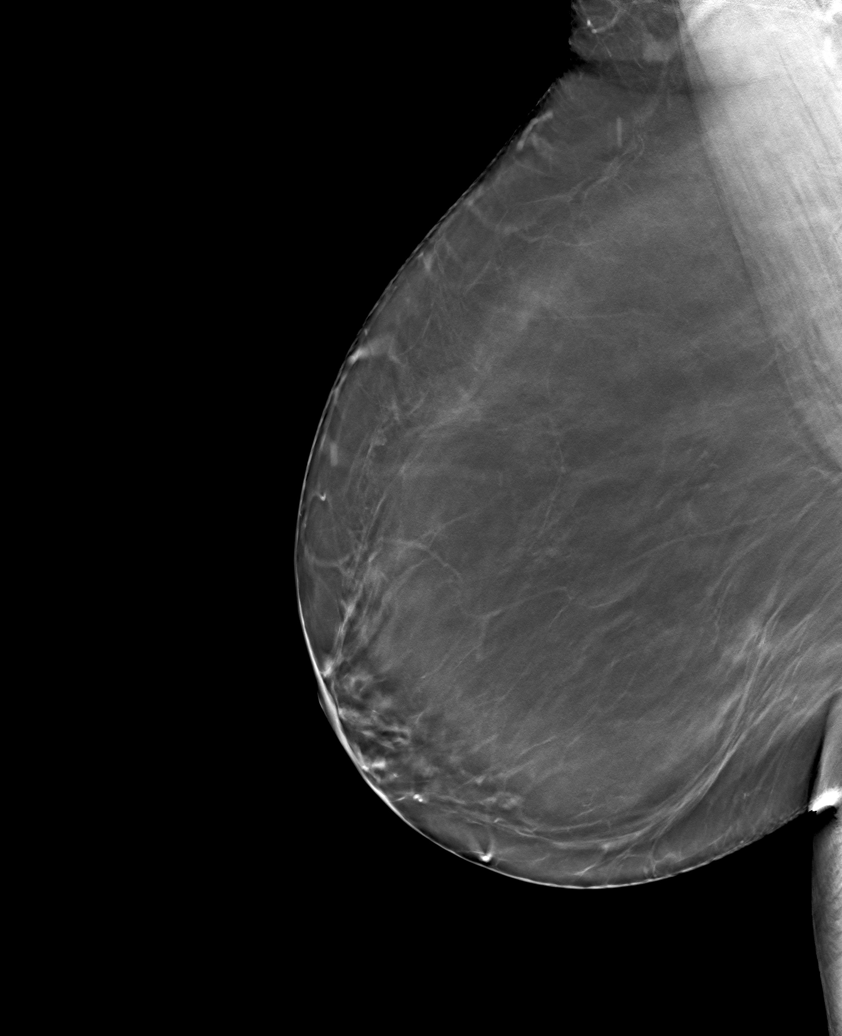

[6 of 30 positions shown; findings below may reference images not displayed]

ACR Breast Density Category b: There are scattered areas of
fibroglandular density.
FINDINGS: There are no findings suspicious for malignancy.
IMPRESSION: No mammographic evidence of malignancy. A result letter of this
screening mammogram will be mailed directly to the patient.

RECOMMENDATION:
Screening mammogram in one year. (Code:[id number]-[id number])

BI-RADS CATEGORY  1: Negative.

## 2022-12-05 ENCOUNTER — Ambulatory Visit (HOSPITAL_COMMUNITY)
Admission: RE | Admit: 2022-12-05 | Discharge: 2022-12-05 | Disposition: A | Discharge status: Home / Self Care | Payer: Medicaid Other | Source: Ambulatory Visit | Attending: Orthopedic Surgery | Admitting: Orthopedic Surgery

## 2022-12-05 DIAGNOSIS — M545 Low back pain, unspecified: Secondary | ICD-10-CM | POA: Insufficient documentation

## 2022-12-13 ENCOUNTER — Ambulatory Visit: Payer: Medicaid Other | Attending: Internal Medicine | Admitting: Internal Medicine

## 2022-12-13 ENCOUNTER — Ambulatory Visit: Payer: Medicaid Other | Attending: Internal Medicine

## 2022-12-13 ENCOUNTER — Encounter: Payer: Self-pay | Admitting: Internal Medicine

## 2022-12-13 VITALS — BP 122/80 | HR 75 | Ht 60.0 in | Wt 197.2 lb

## 2022-12-13 DIAGNOSIS — R001 Bradycardia, unspecified: Secondary | ICD-10-CM

## 2022-12-13 DIAGNOSIS — E7849 Other hyperlipidemia: Secondary | ICD-10-CM | POA: Diagnosis not present

## 2022-12-13 DIAGNOSIS — E785 Hyperlipidemia, unspecified: Secondary | ICD-10-CM | POA: Insufficient documentation

## 2022-12-13 NOTE — Progress Notes (Signed)
Sleep Apnea Evaluation  [facility]  Today's Date: 12/13/2022   Patient Name: Sonya Gray        DOB: [birth year]       Height:  5' (1.524 m)     Weight: 197 lb 3.2 oz (89.4 kg)  BMI: Body mass index is 38.51 kg/m.    Referring Provider:  Luane School, MD   STOP-BANG RISK ASSESSMENT         If STOP-BANG Score ?3 OR two clinical symptoms - patient qualifies for WatchPAT (CPT 95800)      Sleep study ordered due to two (2) of the following clinical symptoms/diagnoses:  Excessive daytime sleepiness G47.10  Gastroesophageal reflux K21.9  Nocturia R35.1  Morning Headaches G44.221  Difficulty concentrating R41.840  Memory problems or poor judgment G31.84  Personality changes or irritability R45.4  Loud snoring R06.83  Depression F32.9  Unrefreshed by sleep G47.8  Impotence N52.9  History of high blood pressure R03.0  Insomnia G47.00  Sleep Disordered Breathing or Sleep Apnea ICD G47.33

## 2022-12-13 NOTE — Progress Notes (Signed)
Cardiology Office Note  Date: 12/13/2022   ID: Jalila, Goosby 1971-01-23, MRN [id number]  PCP:  Wilmon Pali, FNP  Cardiologist:  Marjo Bicker, MD Electrophysiologist:  None   History of Present Illness: Sonya Gray is a 52 y.o. female with no PMH was referred to cardiology clinic for evaluation of severe bradycardia during colonoscopy.  Patient underwent colonoscopy recently, op report documented severe bradycardia with HR as low as 30s and even in went down to 10s for 1 to 2 seconds during the procedure.  She denies having any low heart rate prior.  No symptoms of dizziness, presyncope, syncope, severe fatigue.  She had 1 occasion where she felt dizzy when she drank too much of coffee as she does not drink coffee usually.  No prior ischemia evaluation.  History reviewed. No pertinent past medical history.  Past Surgical History:  Procedure Laterality Date   COLONOSCOPY WITH PROPOFOL N/A 09/29/2022   Procedure: COLONOSCOPY WITH PROPOFOL;  Surgeon: Lanelle Bal, DO;  [facility];  Service: Endoscopy;  Laterality: N/A;  11:15 am, ASA 2   TUBAL LIGATION      No current outpatient medications on file.   No current facility-administered medications for this visit.   Allergies:  Cepacol [benzocaine-menthol] and Compazine [prochlorperazine]   Social History: The patient  reports that she has never smoked. She has never used smokeless tobacco. She reports current alcohol use. She reports that she does not use drugs.   Family History: The patient's family history includes Cancer in her father and sister; Heart disease in her mother; Heart failure in her mother.   ROS:  Please see the history of present illness. Otherwise, complete review of systems is positive for none.  All other systems are reviewed and negative.   Physical Exam: VS:  BP 122/80 (BP Location: Right Arm, Patient Position: Sitting, Cuff Size: Large)   Pulse 75   Ht 5' (1.524 m)   Wt  197 lb 3.2 oz (89.4 kg)   SpO2 96%   BMI 38.51 kg/m , BMI Body mass index is 38.51 kg/m.  Wt Readings from Last 3 Encounters:  12/13/22 197 lb 3.2 oz (89.4 kg)  11/09/22 195 lb (88.5 kg)  09/28/22 195 lb (88.5 kg)    General: Patient appears comfortable at rest. HEENT: Conjunctiva and lids normal, oropharynx clear with moist mucosa. Neck: Supple, no elevated JVP or carotid bruits, no thyromegaly. Lungs: Clear to auscultation, nonlabored breathing at rest. Cardiac: Regular rate and rhythm, no S3 or significant systolic murmur, no pericardial rub. Abdomen: Soft, nontender, no hepatomegaly, bowel sounds present, no guarding or rebound. Extremities: No pitting edema, distal pulses 2+. Skin: Warm and dry. Musculoskeletal: No kyphosis. Neuropsychiatric: Alert and oriented x3, affect grossly appropriate.  Recent Labwork: 12/28/2021: ALT 27; AST 22     Component Value Date/Time   CHOL 242 (H) 12/28/2021 0806   TRIG 85 12/28/2021 0806   HDL 57 12/28/2021 0806   CHOLHDL 4.2 12/28/2021 0806   VLDL 17 12/28/2021 0806   LDLCALC 168 (H) 12/28/2021 0806     Assessment and Plan:   Severe bradycardia during colonoscopy: Op report noted severe bradycardia in 30s and even in 10s for 1 to 2 seconds.  I do not have the strips to review.  EKG today showed NSR, HR 70s.  Patient was never told she had a low heart rate.  Denies symptoms of dizziness, presyncope, syncope and severe fatigue.  Obtain TSH, home sleep study and  OSA evaluation.  HLD, not at goal: LDL 168 from 12/2021.  Goal LDL less than 100.  Follow-up with PCP for HLD management.   Medication Adjustments/Labs and Tests Ordered: Current medicines are reviewed at length with the patient today.  Concerns regarding medicines are outlined above.    Disposition:  Follow up pending results  Signed, Mathew Storck Verne Spurr, MD, 12/13/2022 4:37 PM    [facility] at [facility] [address] [place], [place]  [address]

## 2022-12-13 NOTE — Patient Instructions (Signed)
Medication Instructions:  Your physician recommends that you continue on your current medications as directed. Please refer to the Current Medication list given to you today.  *If you need a refill on your cardiac medications before your next appointment, please call your pharmacy*   Lab Work: TSH  If you have labs (blood work) drawn today and your tests are completely normal, you will receive your results only by: MyChart Message (if you have MyChart) OR A paper copy in the mail If you have any lab test that is abnormal or we need to change your treatment, we will call you to review the results.   Testing/Procedures: Itamar Sleep Study Zio Monitor- 2 weeks   Follow-Up: At [facility], you and your health needs are our priority.  As part of our continuing mission to provide you with exceptional heart care, we have created designated Provider Care Teams.  These Care Teams include your primary Cardiologist (physician) and Advanced Practice Providers (APPs -  Physician Assistants and Nurse Practitioners) who all work together to provide you with the care you need, when you need it.  We recommend signing up for the patient portal called "MyChart".  Sign up information is provided on this After Visit Summary.  MyChart is used to connect with patients for Virtual Visits (Telemedicine).  Patients are able to view lab/test results, encounter notes, upcoming appointments, etc.  Non-urgent messages can be sent to your provider as well.   To learn more about what you can do with MyChart, go to [email or web address].    Your next appointment:    Follow up is pending testing results  Other Instructions ZIO XT- Long Term Monitor Instructions   Your physician has requested you wear your ZIO patch monitor___14____days.   This is a single patch monitor.  [employer] supplies one patch monitor per enrollment.  Additional stickers are not available.   Please do not apply patch if you will be  having a Nuclear Stress Test, Echocardiogram, Cardiac CT, MRI, or Chest Xray during the time frame you would be wearing the monitor. The patch cannot be worn during these tests.  You cannot remove and re-apply the ZIO XT patch monitor.   Your ZIO patch monitor will be sent USPS Priority mail from [facility] directly to your home address. The monitor may also be mailed to a PO BOX if home delivery is not available.   It may take 3-5 days to receive your monitor after you have been enrolled.   Once you have received you monitor, please review enclosed instructions.  Your monitor has already been registered assigning a specific monitor serial # to you.   Applying the monitor   Shave hair from upper left chest.   Hold abrader disc by orange tab.  Rub abrader in 40 strokes over left upper chest as indicated in your monitor instructions.   Clean area with 4 enclosed alcohol pads .  Use all pads to assure are is cleaned thoroughly.  Let dry.   Apply patch as indicated in monitor instructions.  Patch will be place under collarbone on left side of chest with arrow pointing upward.   Rub patch adhesive wings for 2 minutes.Remove white label marked "1".  Remove white label marked "2".  Rub patch adhesive wings for 2 additional minutes.   While looking in a mirror, press and release button in center of patch.  A small green light will flash 3-4 times .  This will be your only indicator the  monitor has been turned on.     Do not shower for the first 24 hours.  You may shower after the first 24 hours.   Press button if you feel a symptom. You will hear a small click.  Record Date, Time and Symptom in the Patient Log Book.   When you are ready to remove patch, follow instructions on last 2 pages of Patient Log Book.  Stick patch monitor onto last page of Patient Log Book.   Place Patient Log Book in Muddy box.  Use locking tab on box and tape box closed securely.  The [employer] and [employer] has  [employer] on it.  Please place in mailbox as soon as possible.  Your physician should have your test results approximately 7 days after the monitor has been mailed back to [facility].   Call [facility] Customer Care at [phone] if you have questions regarding your ZIO XT patch monitor.  Call them immediately if you see an orange light blinking on your monitor.   If your monitor falls off in less than 4 days contact our Monitor department at [phone].  If your monitor becomes loose or falls off after 4 days call [employer] at [phone] for suggestions on securing your monitor.

## 2022-12-14 LAB — TSH: TSH: 2.09 u[IU]/mL (ref 0.450–4.500)

## 2022-12-20 ENCOUNTER — Telehealth: Payer: Self-pay | Admitting: Internal Medicine

## 2022-12-20 NOTE — Telephone Encounter (Signed)
Patient is requesting call back to see when she will be able to use the itamar sleep study and if it is approved through insurance. Please advise.

## 2022-12-21 ENCOUNTER — Ambulatory Visit (INDEPENDENT_AMBULATORY_CARE_PROVIDER_SITE_OTHER): Payer: Medicaid Other | Admitting: Podiatry

## 2022-12-21 ENCOUNTER — Ambulatory Visit (INDEPENDENT_AMBULATORY_CARE_PROVIDER_SITE_OTHER): Payer: Medicaid Other

## 2022-12-21 ENCOUNTER — Encounter: Payer: Self-pay | Admitting: Podiatry

## 2022-12-21 DIAGNOSIS — M778 Other enthesopathies, not elsewhere classified: Secondary | ICD-10-CM

## 2022-12-21 DIAGNOSIS — M722 Plantar fascial fibromatosis: Secondary | ICD-10-CM

## 2022-12-21 MED ORDER — MELOXICAM 15 MG PO TABS: 15.0000 mg | ORAL_TABLET | Freq: Every day | ORAL | 0 refills | Status: DC | PRN

## 2022-12-21 NOTE — Progress Notes (Signed)
Subjective:   Patient ID: Sonya Gray, female   DOB: 52 y.o.   MRN: [id number]   HPI Chief Complaint  Patient presents with   Foot Pain    RM12: new pt-bilateral foot pain, mostly when standing off and on x 2 months   The patient presents with bilateral foot pain, more severe on the right, for the past two months. They deny any precipitating injury or prior treatment. The pain is primarily located in the arch of the foot and sometimes on the top of the foot, described as a 'gripping' sensation.  They deny any associated swelling, numbness, or tingling in the feet, but do report occasional tingling in the thigh and not sure if this is related. They have noticed a change in the shape of their feet, which have become flatter over time.   Review of Systems  All other systems reviewed and are negative.  History reviewed. No pertinent past medical history.  Past Surgical History:  Procedure Laterality Date   COLONOSCOPY WITH PROPOFOL N/A 09/29/2022   Procedure: COLONOSCOPY WITH PROPOFOL;  Surgeon: Lanelle Bal, DO;  Location: [facility];  Service: Endoscopy;  Laterality: N/A;  11:15 am, ASA 2   TUBAL LIGATION       Current Outpatient Medications:    meloxicam (MOBIC) 15 MG tablet, Take 1 tablet (15 mg total) by mouth daily as needed for pain., Disp: 30 tablet, Rfl: 0  Allergies  Allergen Reactions   Cepacol [Benzocaine-Menthol] Hives   Compazine [Prochlorperazine]            Objective:  Physical Exam  General: AAO x3, NAD  Dermatological: Skin is warm, dry and supple bilateral.  There are no open sores, no preulcerative lesions, no rash or signs of infection present.  Vascular: Dorsalis Pedis artery and Posterior Tibial artery pedal pulses are 2/4 bilateral with immedate capillary fill time.  There is no pain with calf compression, swelling, warmth, erythema.   Neruologic: Grossly intact via light touch bilateral.   Musculoskeletal:. Strength in lower extremities  assessed as good, 5/5.  No pain on palpation of lateral aspect of ankle. Mild pain on palpation of medial aspect of ankle. No pain on palpation of top of foot.  Mild discomfort upon palpation of arch of foot. No pain on palpation of bottom of heel. No pain on palpation of back of heel. No pain on palpation of left foot. No pain on palpation of top of left foot. No pain on palpation of ankles.  Soft tissue masses noted to lateral aspect of ankle and sinus tarsi.  Gait: Unassisted, Nonantalgic.       Assessment:   Forefoot deformity, plantar fasciitis/tendinitis     Plan:  Bilateral foot pain for two months, worse on the right, primarily in the arch and top of the foot. No associated swelling, numbness, or tingling. Flat feet noted on examination.   X-rays obtained reviewed.  3 views of the foot were obtained.  X-ray shows flat foot, bone spurring on the top of the right foot and big toe joint.  -Start Meloxicam as needed for inflammation. -Perform daily exercises for plantar fasciitis and calf muscle stretching. -Use a frozen water bottle to roll the arch of the feet daily for inflammation and muscle relaxation. -Wear shoes with good arch support and consider inserts (Power Steps, Superfeet, Aetrex). -Avoid going barefoot, even at home. -I do not think the nerve symptoms evaluates her foot and follow-up with her PCP for this. -There is a  soft tissue mass on the ankle laterally do not hurt and we will observe for now. -Check back if symptoms do not improve.  Vivi Barrack DPM

## 2022-12-21 NOTE — Patient Instructions (Signed)
For inserts I like powersteps, superfeet, aetrex and shoes Brooks, New Balance, Hoka  -  Plantar Fasciitis (Heel Spur Syndrome) with Rehab The plantar fascia is a fibrous, ligament-like, soft-tissue structure that spans the bottom of the foot. Plantar fasciitis is a condition that causes pain in the foot due to inflammation of the tissue. SYMPTOMS  Pain and tenderness on the underneath side of the foot. Pain that worsens with standing or walking. CAUSES  Plantar fasciitis is caused by irritation and injury to the plantar fascia on the underneath side of the foot. Common mechanisms of injury include: Direct trauma to bottom of the foot. Damage to a small nerve that runs under the foot where the main fascia attaches to the heel bone. Stress placed on the plantar fascia due to bone spurs. RISK INCREASES WITH:  Activities that place stress on the plantar fascia (running, jumping, pivoting, or cutting). Poor strength and flexibility. Improperly fitted shoes. Tight calf muscles. Flat feet. Failure to warm-up properly before activity. Obesity. PREVENTION Warm up and stretch properly before activity. Allow for adequate recovery between workouts. Maintain physical fitness: Strength, flexibility, and endurance. Cardiovascular fitness. Maintain a health body weight. Avoid stress on the plantar fascia. Wear properly fitted shoes, including arch supports for individuals who have flat feet.  PROGNOSIS  If treated properly, then the symptoms of plantar fasciitis usually resolve without surgery. However, occasionally surgery is necessary.  RELATED COMPLICATIONS  Recurrent symptoms that may result in a chronic condition. Problems of the lower back that are caused by compensating for the injury, such as limping. Pain or weakness of the foot during push-off following surgery. Chronic inflammation, scarring, and partial or complete fascia tear, occurring more often from repeated  injections.  TREATMENT  Treatment initially involves the use of ice and medication to help reduce pain and inflammation. The use of strengthening and stretching exercises may help reduce pain with activity, especially stretches of the Achilles tendon. These exercises may be performed at home or with a therapist. Your caregiver may recommend that you use heel cups of arch supports to help reduce stress on the plantar fascia. Occasionally, corticosteroid injections are given to reduce inflammation. If symptoms persist for greater than 6 months despite non-surgical (conservative), then surgery may be recommended.   MEDICATION  If pain medication is necessary, then nonsteroidal anti-inflammatory medications, such as aspirin and ibuprofen, or other minor pain relievers, such as acetaminophen, are often recommended. Do not take pain medication within 7 days before surgery. Prescription pain relievers may be given if deemed necessary by your caregiver. Use only as directed and only as much as you need. Corticosteroid injections may be given by your caregiver. These injections should be reserved for the most serious cases, because they may only be given a certain number of times.  HEAT AND COLD Cold treatment (icing) relieves pain and reduces inflammation. Cold treatment should be applied for 10 to 15 minutes every 2 to 3 hours for inflammation and pain and immediately after any activity that aggravates your symptoms. Use ice packs or massage the area with a piece of ice (ice massage). Heat treatment may be used prior to performing the stretching and strengthening activities prescribed by your caregiver, physical therapist, or athletic trainer. Use a heat pack or soak the injury in warm water.  SEEK IMMEDIATE MEDICAL CARE IF: Treatment seems to offer no benefit, or the condition worsens. Any medications produce adverse side effects.  EXERCISES- RANGE OF MOTION (ROM) AND STRETCHING EXERCISES - Plantar  Fasciitis (Heel Spur Syndrome) These exercises may help you when beginning to rehabilitate your injury. Your symptoms may resolve with or without further involvement from your physician, physical therapist or athletic trainer. While completing these exercises, remember:  Restoring tissue flexibility helps normal motion to return to the joints. This allows healthier, less painful movement and activity. An effective stretch should be held for at least 30 seconds. A stretch should never be painful. You should only feel a gentle lengthening or release in the stretched tissue.  RANGE OF MOTION - Toe Extension, Flexion Sit with your right / left leg crossed over your opposite knee. Grasp your toes and gently pull them back toward the top of your foot. You should feel a stretch on the bottom of your toes and/or foot. Hold this stretch for 10 seconds. Now, gently pull your toes toward the bottom of your foot. You should feel a stretch on the top of your toes and or foot. Hold this stretch for 10 seconds. Repeat  times. Complete this stretch 3 times per day.   RANGE OF MOTION - Ankle Dorsiflexion, Active Assisted Remove shoes and sit on a chair that is preferably not on a carpeted surface. Place right / left foot under knee. Extend your opposite leg for support. Keeping your heel down, slide your right / left foot back toward the chair until you feel a stretch at your ankle or calf. If you do not feel a stretch, slide your bottom forward to the edge of the chair, while still keeping your heel down. Hold this stretch for 10 seconds. Repeat 3 times. Complete this stretch 2 times per day.   STRETCH  Gastroc, Standing Place hands on wall. Extend right / left leg, keeping the front knee somewhat bent. Slightly point your toes inward on your back foot. Keeping your right / left heel on the floor and your knee straight, shift your weight toward the wall, not allowing your back to arch. You should feel a  gentle stretch in the right / left calf. Hold this position for 10 seconds. Repeat 3 times. Complete this stretch 2 times per day.  STRETCH  Soleus, Standing Place hands on wall. Extend right / left leg, keeping the other knee somewhat bent. Slightly point your toes inward on your back foot. Keep your right / left heel on the floor, bend your back knee, and slightly shift your weight over the back leg so that you feel a gentle stretch deep in your back calf. Hold this position for 10 seconds. Repeat 3 times. Complete this stretch 2 times per day.  STRETCH  Gastrocsoleus, Standing  Note: This exercise can place a lot of stress on your foot and ankle. Please complete this exercise only if specifically instructed by your caregiver.  Place the ball of your right / left foot on a step, keeping your other foot firmly on the same step. Hold on to the wall or a rail for balance. Slowly lift your other foot, allowing your body weight to press your heel down over the edge of the step. You should feel a stretch in your right / left calf. Hold this position for 10 seconds. Repeat this exercise with a slight bend in your right / left knee. Repeat 3 times. Complete this stretch 2 times per day.   STRENGTHENING EXERCISES - Plantar Fasciitis (Heel Spur Syndrome)  These exercises may help you when beginning to rehabilitate your injury. They may resolve your symptoms with or without further involvement  from your physician, physical therapist or athletic trainer. While completing these exercises, remember:  Muscles can gain both the endurance and the strength needed for everyday activities through controlled exercises. Complete these exercises as instructed by your physician, physical therapist or athletic trainer. Progress the resistance and repetitions only as guided.  STRENGTH - Towel Curls Sit in a chair positioned on a non-carpeted surface. Place your foot on a towel, keeping your heel on the  floor. Pull the towel toward your heel by only curling your toes. Keep your heel on the floor. Repeat 3 times. Complete this exercise 2 times per day.  STRENGTH - Ankle Inversion Secure one end of a rubber exercise band/tubing to a fixed object (table, pole). Loop the other end around your foot just before your toes. Place your fists between your knees. This will focus your strengthening at your ankle. Slowly, pull your big toe up and in, making sure the band/tubing is positioned to resist the entire motion. Hold this position for 10 seconds. Have your muscles resist the band/tubing as it slowly pulls your foot back to the starting position. Repeat 3 times. Complete this exercises 2 times per day.  Document Released: 03/27/2005 Document Revised: 06/19/2011 Document Reviewed: 07/09/2008 Exit[employer] Patient Information 2014 Exit[employer], [place].  ---  Meloxicam Tablets What is this medication? MELOXICAM (mel OX i cam) treats mild to moderate pain, inflammation, or arthritis. It works by decreasing inflammation. It belongs to a group of medications called NSAIDs. This medicine may be used for other purposes; ask your health care provider or pharmacist if you have questions. COMMON BRAND NAME(S): Mobic What should I tell my care team before I take this medication? They need to know if you have any of these conditions: Asthma (lung or breathing disease) Bleeding disorder Coronary artery bypass graft (CABG) within the past 2 weeks Dehydration Frequently drink alcohol Heart attack Heart disease Heart failure High blood pressure Kidney disease Liver disease Stomach bleeding Stomach ulcers, other stomach or intestine problems Take medications that treat or prevent blood clots Taking other steroids, such as dexamethasone or prednisone Tobacco use An unusual or allergic reaction to meloxicam, other medications, foods, dyes, or preservatives Pregnant or trying to get pregnant Breast-feeding How  should I use this medication? Take this medication by mouth. Take it as directed on the prescription label at the same time every day. You can take it with or without food. If it upsets your stomach, take it with food. Do not use it more often than directed. There may be unused or extra doses in the bottle after you finish your treatment. Talk to your care team if you have questions about your dose. A special MedGuide will be given to you by the pharmacist with each prescription and refill. Be sure to read this information carefully each time. Talk to your care team about the use of this medication in children. Special care may be needed. People over 72 years of age may have a stronger reaction and need a smaller dose. Overdosage: If you think you have taken too much of this medicine contact a poison control center or emergency room at once. NOTE: This medicine is only for you. Do not share this medicine with others. What if I miss a dose? If you miss a dose, take it as soon as you can. If it is almost time for your next dose, take only that dose. Do not take double or extra doses. What may interact with this medication? Do not take  this medication with any of the following: Cidofovir Ketorolac This medication may also interact with the following: Alcohol Aspirin and aspirin-like medications Certain medications for blood pressure, heart disease, irregular heart beat Certain medications for mental health conditions Certain medications that treat or prevent blood clots, such as warfarin, enoxaparin, dalteparin, apixaban, dabigatran, rivaroxaban Cyclosporine Diuretics Fluconazole Lithium Methotrexate Other NSAIDs, medications for pain and inflammation, such as ibuprofen and naproxen Pemetrexed This list may not describe all possible interactions. Give your health care provider a list of all the medicines, herbs, non-prescription drugs, or dietary supplements you use. Also tell them if you  smoke, drink alcohol, or use illegal drugs. Some items may interact with your medicine. What should I watch for while using this medication? Visit your care team for regular checks on your progress. Tell your care team if your symptoms do not start to get better or if they get worse. Do not take other medications that contain aspirin, ibuprofen, or naproxen with this medication. Side effects such as stomach upset, nausea, or ulcers may be more likely to occur. Many non-prescription medications contain aspirin, ibuprofen, or naproxen. Always read labels carefully. This medication can cause serious ulcers and bleeding in the stomach. It can happen with no warning. Tobacco, alcohol, older age, and poor health can also increase risks. Call your care team right away if you have stomach pain or blood in your vomit or stool. This medication does not prevent a heart attack or stroke. This medication may increase the chance of a heart attack or stroke. The chance may increase the longer you use this medication or if you have heart disease. If you take aspirin to prevent a heart attack or stroke, talk to your care team about using this medication. This medication may cause serious skin reactions. They can happen weeks to months after starting the medication. Contact your care team right away if you notice fevers or flu-like symptoms with a rash. The rash may be red or purple and then turn into blisters or peeling of the skin. Or, you might notice a red rash with swelling of the face, lips or lymph nodes in your neck or under your arms. Talk to your care team if you wish to become pregnant or think you might be pregnant. This medication can cause serious birth defects. This medication may affect your coordination, reaction time, or judgment. Do not drive or operate machinery until you know how this medication affects you. Sit up or stand slowly to reduce the risk of dizzy or fainting spells. Drinking alcohol with this  medication can increase the risk of these side effects. Be careful brushing or flossing your teeth or using a toothpick because you may get an infection or bleed more easily. If you have any dental work done, tell your dentist you are receiving this medication. This medication may make it more difficult to get pregnant. Talk to your care team if you are concerned about your fertility. What side effects may I notice from receiving this medication? Side effects that you should report to your care team as soon as possible: Allergic reactions--skin rash, itching, hives, swelling of the face, lips, tongue, or throat Bleeding--bloody or black, tar-like stools, vomiting blood or brown material that looks like coffee grounds, red or dark brown urine, small red or purple spots on skin, unusual bruising or bleeding Heart attack--pain or tightness in the chest, shoulders, arms, or jaw, nausea, shortness of breath, cold or clammy skin, feeling faint or lightheaded Heart  failure--shortness of breath, swelling of ankles, feet, or hands, sudden weight gain, unusual weakness or fatigue Increase in blood pressure Kidney injury--decrease in the amount of urine, swelling of the ankles, hands, or feet Liver injury--right upper belly pain, loss of appetite, nausea, light-colored stool, dark yellow or brown urine, yellowing skin or eyes, unusual weakness or fatigue Rash, fever, and swollen lymph nodes Redness, blistering, peeling, or loosening of the skin, including inside the mouth Stroke--sudden numbness or weakness of the face, arm, or leg, trouble speaking, confusion, trouble walking, loss of balance or coordination, dizziness, severe headache, change in vision Side effects that usually do not require medical attention (report to your care team if they continue or are bothersome): Diarrhea Nausea Upset stomach This list may not describe all possible side effects. Call your doctor for medical advice about side  effects. You may report side effects to FDA at 1-800-FDA-1088. Where should I keep my medication? Keep out of the reach of children and pets. Store at room temperature between 20 and 25 degrees C (68 and 77 degrees F). Protect from moisture. Keep the container tightly closed. Get rid of any unused medication after the expiration date. To get rid of medications that are no longer needed or have expired: Take the medication to a medication take-back program. Check with your pharmacy or law enforcement to find a location. If you cannot return the medication, check the label or package insert to see if the medication should be thrown out in the garbage or flushed down the toilet. If you are not sure, ask your care team. If it is safe to put it in the trash, empty the medication out of the container. Mix the medication with cat litter, dirt, coffee grounds, or other unwanted substance. Seal the mixture in a bag or container. Put it in the trash. NOTE: This sheet is a summary. It may not cover all possible information. If you have questions about this medicine, talk to your doctor, pharmacist, or health care provider.  2024 [employer]vier/Gold Standard (2020-12-08 00:00:00)

## 2022-12-26 NOTE — Addendum Note (Signed)
Addended by: Brunetta Genera on: 12/26/2022 08:55 AM   Modules accepted: Orders

## 2022-12-28 ENCOUNTER — Other Ambulatory Visit (INDEPENDENT_AMBULATORY_CARE_PROVIDER_SITE_OTHER): Payer: Medicaid Other

## 2022-12-28 ENCOUNTER — Ambulatory Visit (INDEPENDENT_AMBULATORY_CARE_PROVIDER_SITE_OTHER): Payer: Medicaid Other | Admitting: Orthopaedic Surgery

## 2022-12-28 DIAGNOSIS — M545 Low back pain, unspecified: Secondary | ICD-10-CM | POA: Diagnosis not present

## 2022-12-28 DIAGNOSIS — M48062 Spinal stenosis, lumbar region with neurogenic claudication: Secondary | ICD-10-CM | POA: Diagnosis not present

## 2022-12-28 DIAGNOSIS — M48061 Spinal stenosis, lumbar region without neurogenic claudication: Secondary | ICD-10-CM | POA: Insufficient documentation

## 2022-12-28 NOTE — Progress Notes (Signed)
Office Visit Note   Patient: Sonya Gray           Date of Birth: 1970/07/20           MRN: [id number] Visit Date: 12/28/2022              Requested by: Wilmon Pali, FNP [address] [place],  [place] [address] PCP: Wilmon Pali, FNP   Assessment & Plan: Visit Diagnoses:  1. Lumbar pain   2. Spinal stenosis of lumbar region with neurogenic claudication     Plan: Currently patient can walk 3-4 blocks she believes.  We discussed neurogenic claudication symptoms and if she has increased symptoms she will let us know.  We discussed options including decompression alone versus decompression and fusion.  She does have significant facet fluid in the L3-4 facet joints bilaterally but is not shifting on flexion extension.  She has increased symptoms we can let her see Dr. Christell Constant.  Follow-Up Instructions: Return in about 1 month (around 01/27/2023).   Orders:  Orders Placed This Encounter  Procedures   XR Lumbar Spine 2-3 Views   No orders of the defined types were placed in this encounter.     Procedures: No procedures performed   Clinical Data: No additional findings.   Subjective: Chief Complaint  Patient presents with   Lower Back - Follow-up    MRI results    HPI 52 year old female returns with progressive weakness in her legs with standing or walking a short distance.  She has some degenerative anterolisthesis at L3-4 and due to progressive symptoms and increased pain slightly worse on the right than left leg MRI scan was ordered by Dr. Christell Constant and patient returns for review.  This shows 3 mm anterolisthesis significant facet arthropathy and severe stenosis at L3-4.  She states that she is having trouble shopping and walking and has to sit to get some relief and ibuprofen is not helping.  Patient has completed therapy earlier this year and noted some improvement more in her neck then lumbar spine.  Back symptoms date back to when she used to live in [place] more  than 2 years ago with intermittent sciatica.  Her current claudication symptoms are new to her.  She gets relief leaning forward.  Review of Systems patient does have some hyperlipidemia.  Mild neck discomfort without upper extremity radicu.  Lopathy symptoms.  No cardiac history.  Patient's been healthy other than her current back pain and claudication symptoms   Objective: Vital Signs: There were no vitals taken for this visit.  Physical Exam Constitutional:      Appearance: She is well-developed.  HENT:     Head: Normocephalic.     Right Ear: External ear normal.     Left Ear: External ear normal. There is no impacted cerumen.  Eyes:     Pupils: Pupils are equal, round, and reactive to light.  Neck:     Thyroid: No thyromegaly.     Trachea: No tracheal deviation.  Cardiovascular:     Rate and Rhythm: Normal rate.  Pulmonary:     Effort: Pulmonary effort is normal.  Abdominal:     Palpations: Abdomen is soft.  Musculoskeletal:     Cervical back: No rigidity.  Skin:    General: Skin is warm and dry.  Neurological:     Mental Status: She is alert and oriented to person, place, and time.  Psychiatric:        Behavior: Behavior  normal.     Ortho Exam some sciatic notch tenderness on the right.  No scoliosis.  Hip range of motion is intact, pulses are normal.  Negative straight leg raising 90 degrees.  Some sciatic notch tenderness on the right negative on the left.  Specialty Comments:  No specialty comments available.  Imaging: Narrative & Impression  CLINICAL DATA:  Low back pain for over 6 weeks.   EXAM: MRI LUMBAR SPINE WITHOUT CONTRAST   TECHNIQUE: Multiplanar, multisequence MR imaging of the lumbar spine was performed. No intravenous contrast was administered.   COMPARISON:  None Available.   FINDINGS: Segmentation:  Standard.   Alignment: Minimal grade 1 anterolisthesis of L3 on L4 secondary to facet disease.   Vertebrae: No acute fracture, evidence  of discitis, or aggressive bone lesion.   Conus medullaris and cauda equina: Conus extends to the L1 level. Conus and cauda equina appear normal.   Paraspinal and other soft tissues: No acute paraspinal abnormality.   Disc levels:   Disc spaces: Mild degenerative disease with disc height loss at L3-4.   T12-L1: No significant disc bulge. No neural foraminal stenosis. No central canal stenosis.   L1-L2: No significant disc bulge. No neural foraminal stenosis. No central canal stenosis.   L2-L3: Mild broad-based disc bulge. Mild bilateral facet arthropathy. No foraminal or central canal stenosis.   L3-L4: Broad-based disc bulge flattening the ventral thecal sac. Severe bilateral facet arthropathy with bilateral facet effusions. Severe spinal stenosis. Bilateral subarticular recess stenosis. Mild bilateral foraminal stenosis.   L4-L5: Mild broad-based disc bulge. Mild bilateral facet arthropathy. No foraminal or central canal stenosis.   L5-S1: No significant disc bulge. Mild bilateral facet arthropathy. No foraminal or central canal stenosis.   IMPRESSION: 1. At L3-4 there is a broad-based disc bulge flattening the ventral thecal sac. Severe bilateral facet arthropathy with bilateral facet effusions. Severe spinal stenosis. Bilateral subarticular recess stenosis. Mild bilateral foraminal stenosis. 2. No acute osseous injury of the lumbar spine.     Electronically Signed   By: Elige Ko M.D.   On: 12/21/2022 09:46     PMFS History: Patient Active Problem List   Diagnosis Date Noted   Spinal stenosis of lumbar region 12/28/2022   Severe sinus bradycardia 12/13/2022   HLD (hyperlipidemia) 12/13/2022   Neck pain 09/28/2022   Low back pain 09/28/2022   No past medical history on file.  Family History  Problem Relation Age of Onset   Heart disease Mother    Heart failure Mother    Cancer Father    Cancer Sister     Past Surgical History:  Procedure  Laterality Date   COLONOSCOPY WITH PROPOFOL N/A 09/29/2022   Procedure: COLONOSCOPY WITH PROPOFOL;  Surgeon: Lanelle Bal, DO;  [facility];  Service: Endoscopy;  Laterality: N/A;  11:15 am, ASA 2   TUBAL LIGATION     Social History   Occupational History   Not on file  Tobacco Use   Smoking status: Never   Smokeless tobacco: Never  Vaping Use   Vaping status: Never Used  Substance and Sexual Activity   Alcohol use: Yes    Comment: rare   Drug use: Never   Sexual activity: Not on file

## 2023-01-24 ENCOUNTER — Ambulatory Visit (HOSPITAL_COMMUNITY)
Admission: RE | Admit: 2023-01-24 | Discharge: 2023-01-24 | Disposition: A | Discharge status: Home / Self Care | Payer: Medicaid Other | Source: Ambulatory Visit | Attending: Family Medicine | Admitting: Family Medicine

## 2023-01-24 ENCOUNTER — Encounter (HOSPITAL_COMMUNITY): Payer: Self-pay

## 2023-01-24 DIAGNOSIS — Z1231 Encounter for screening mammogram for malignant neoplasm of breast: Secondary | ICD-10-CM | POA: Insufficient documentation

## 2023-02-15 ENCOUNTER — Ambulatory Visit: Payer: Medicaid Other | Admitting: Orthopaedic Surgery

## 2023-03-02 ENCOUNTER — Telehealth: Payer: Self-pay

## 2023-03-02 NOTE — Telephone Encounter (Signed)
**Note De-Identified [name] Obfuscation** Ordering provider: Dr Jenene Slicker Associated diagnoses: Severe sinus bradycardia-R00.1 WatchPAT PA obtained on 03/02/2023 by Lucindy Borel, Lorelle Formosa, LPN. Authorization: Per the [facility] Provider Portal, Procedure code: 32202 Description: Sleep study, unattended, simultaneous recording; heart rate, oxygen saturation, respiratory analysis (eg, by airflow or peripheral arterial tone), and sleep time Inquiry summary: Notification/Prior Authorization not required for this service. Patient notified of PIN (1234) on 03/02/2023 Sonya Gray Notification Method: MyChart message. I did attempt to reach the pt by phone but got no answer so I left a message on her VM advising her that I am sending her a [facility] message and that if she has any questions or concerns to contact us.  Phone note routed to covering staff for follow-up.

## 2023-03-03 ENCOUNTER — Encounter (INDEPENDENT_AMBULATORY_CARE_PROVIDER_SITE_OTHER): Payer: Medicaid Other | Admitting: Cardiology

## 2023-03-03 DIAGNOSIS — G4733 Obstructive sleep apnea (adult) (pediatric): Secondary | ICD-10-CM | POA: Diagnosis not present

## 2023-03-05 NOTE — Telephone Encounter (Signed)
Notification of completed [place] sleep study. Will route to sleep studies pool to f/u.

## 2023-03-06 ENCOUNTER — Ambulatory Visit: Payer: Medicaid Other | Attending: Internal Medicine

## 2023-03-06 NOTE — Procedures (Signed)
   SLEEP STUDY REPORT Patient Information Study Date: 03/03/2023 Patient Name: Sonya Gray Patient ID: [id number] Birth Date: 19-Apr-1970 Age: 52 Gender: Female BMI: 38.5 (W=196 lb, H=5' 0'') Stopbang: 3 Referring Physician: Luane School, MD  TEST DESCRIPTION: Home sleep apnea testing was completed using the WatchPat, a Type 1 device, utilizing  peripheral arterial tonometry (PAT), chest movement, actigraphy, pulse oximetry, pulse rate, body position and snore.  AHI was calculated with apnea and hypopnea using valid sleep time as the denominator. RDI includes apneas,  hypopneas, and RERAs. The data acquired and the scoring of sleep and all associated events were performed in  accordance with the recommended standards and specifications as outlined in the AASM Manual for the Scoring of  Sleep and Associated Events 2.2.0 (2015).   FINDINGS:   1. Mild Obstructive Sleep Apnea with AHI 7.4/hr.   2. No Central Sleep Apnea with pAHIc 0.4/hr.   3. Oxygen desaturations as low as 86%.   4. Mild to moderate snoring was present. O2 sats were < 88% for 0.5 min.   5. Total sleep time was 7 hrs and 41 min.   6. 26.8% of total sleep time was spent in REM sleep.   7. Prolonged sleep onset latency at 37 min.   8. Normal REM sleep onset latency at 85 min.   9. Total awakenings were 4.  10. Arrhythmia detection: None  DIAGNOSIS: Mild Obstructive Sleep Apnea (G47.33)  RECOMMENDATIONS: 1. Clinical correlation of these findings is necessary. The decision to treat obstructive sleep apnea (OSA) is usually  based on the presence of apnea symptoms or the presence of associated medical conditions such as Hypertension,  Congestive Heart Failure, Atrial Fibrillation or Obesity. The most common symptoms of OSA are snoring, gasping for  breath while sleeping, daytime sleepiness and fatigue.   2. Initiating apnea therapy is recommended given the presence of symptoms and/or associated conditions.   Recommend proceeding with one of the following:   a. Auto-CPAP therapy with a pressure range of 5-20cm H2O.   b. An oral appliance (OA) that can be obtained from certain dentists with expertise in sleep medicine. These are  primarily of use in non-obese patients with mild and moderate disease.   c. An ENT consultation which may be useful to look for specific causes of obstruction and possible treatment  options.   d. If patient is intolerant to PAP therapy, consider referral to ENT for evaluation for hypoglossal nerve stimulator.   3. Close follow-up is necessary to ensure success with CPAP or oral appliance therapy for maximum benefit .  4. A follow-up oximetry study on CPAP is recommended to assess the adequacy of therapy and determine the need  for supplemental oxygen or the potential need for Bi-level therapy. An arterial blood gas to determine the adequacy of  baseline ventilation and oxygenation should also be considered.  5. Healthy sleep recommendations include: adequate nightly sleep (normal 7-9 hrs/night), avoidance of caffeine after  noon and alcohol near bedtime, and maintaining a sleep environment that is cool, dark and quiet.  6. Weight loss for overweight patients is recommended. Even modest amounts of weight loss can significantly  improve the severity of sleep apnea.  7. Snoring recommendations include: weight loss where appropriate, side sleeping, and avoidance of alcohol before  bed.  8. Operation of motor vehicle should be avoided when sleepy.  Signature: Armanda Magic, MD; [facility]; Diplomat, American Board of Sleep  Medicine Electronically Signed: 03/06/2023 5:53:54 PM

## 2023-03-12 ENCOUNTER — Telehealth: Payer: Self-pay | Admitting: *Deleted

## 2023-03-12 NOTE — Telephone Encounter (Signed)
-----   Message from Armanda Magic sent at 03/06/2023  5:56 PM EST ----- Patient has very mild OSA - set up OV to discuss treatment options.

## 2023-03-12 NOTE — Telephone Encounter (Addendum)
The patient has been notified of the result via telephone and her mychart. Please call our office back.Latrelle Dodrill, CMA 03/12/2023 5:44 PM    RETURN CALL:  The patient has been notified of the result and verbalized understanding.  All questions (if any) were answered. Latrelle Dodrill, CMA 03/12/2023 6:02 PM    Message sent to Leah to make the appointment.

## 2023-03-29 ENCOUNTER — Ambulatory Visit: Payer: Medicaid Other | Admitting: Orthopaedic Surgery

## 2023-03-29 ENCOUNTER — Telehealth: Payer: Self-pay | Admitting: Radiology

## 2023-03-29 ENCOUNTER — Encounter: Payer: Self-pay | Admitting: Orthopaedic Surgery

## 2023-03-29 VITALS — Ht 59.0 in | Wt 195.0 lb

## 2023-03-29 DIAGNOSIS — M48062 Spinal stenosis, lumbar region with neurogenic claudication: Secondary | ICD-10-CM | POA: Diagnosis not present

## 2023-03-29 NOTE — Progress Notes (Signed)
Office Visit Note   Patient: Sonya Gray           Date of Birth: [birth year]           MRN: [id number] Visit Date: 03/29/2023              Requested by: Wilmon Pali, FNP [address] [place],  [place] [address] PCP: Wilmon Pali, FNP   Assessment & Plan: Visit Diagnoses:  1. Spinal stenosis of lumbar region with neurogenic claudication     Plan: We discussed spinal stenosis and neurogenic claudication at some point his symptoms may progress and she may require surgery.  She does have severe stenosis at L3-4 and 3 mm of anterolisthesis.  She can follow-up with Dr. Christell Constant when she has more problems and will call.  She knows what symptoms to look for.  Currently her symptoms are not severe enough to consider any surgical intervention.  Follow-Up Instructions: No follow-ups on file.   Orders:  No orders of the defined types were placed in this encounter.  No orders of the defined types were placed in this encounter.     Procedures: No procedures performed   Clinical Data: No additional findings.   Subjective: Chief Complaint  Patient presents with   Lower Back - Pain, Follow-up    HPI 52 year old female with L3-4 spinal stenosis with 3 mm anterolisthesis.  She can walk more than 4 blocks most days she can stand as long as she wants sometimes she has some back pain that radiates into the anterolateral thigh and her knee.  She is also had some discomfort about the level of her bra strap.  She is able to turn and twist and bend without problems no bowel or bladder symptoms.  Review of Systems updated unchanged   Objective: Vital Signs: Ht 4\' 11"  (1.499 m)   Wt 195 lb (88.5 kg)   BMI 39.39 kg/m   Physical Exam Constitutional:      Appearance: She is well-developed.  HENT:     Head: Normocephalic.     Right Ear: External ear normal.     Left Ear: External ear normal. There is no impacted cerumen.  Eyes:     Pupils: Pupils are equal, round, and reactive to  light.  Neck:     Thyroid: No thyromegaly.     Trachea: No tracheal deviation.  Cardiovascular:     Rate and Rhythm: Normal rate.  Pulmonary:     Effort: Pulmonary effort is normal.  Abdominal:     Palpations: Abdomen is soft.  Musculoskeletal:     Cervical back: No rigidity.  Skin:    General: Skin is warm and dry.  Neurological:     Mental Status: She is alert and oriented to person, place, and time.  Psychiatric:        Behavior: Behavior normal.     Ortho Exam skin of the back normal mild sciatic notch tenderness on the right negative on the left negative straight leg raising 90 degrees reflexes are normal normal heel-toe walking.  Specialty Comments:  No specialty comments available.  Imaging: No results found.   PMFS History: Patient Active Problem List   Diagnosis Date Noted   Spinal stenosis of lumbar region 12/28/2022   Severe sinus bradycardia 12/13/2022   HLD (hyperlipidemia) 12/13/2022   Neck pain 09/28/2022   Low back pain 09/28/2022   No past medical history on file.  Family History  Problem Relation Age of Onset  Heart disease Mother    Heart failure Mother    Cancer Father    Cancer Sister     Past Surgical History:  Procedure Laterality Date   COLONOSCOPY WITH PROPOFOL N/A 09/29/2022   Procedure: COLONOSCOPY WITH PROPOFOL;  Surgeon: Lanelle Bal, DO;  [facility];  Service: Endoscopy;  Laterality: N/A;  11:15 am, ASA 2   TUBAL LIGATION     Social History   Occupational History   Not on file  Tobacco Use   Smoking status: Never   Smokeless tobacco: Never  Vaping Use   Vaping status: Never Used  Substance and Sexual Activity   Alcohol use: Yes    Comment: rare   Drug use: Never   Sexual activity: Not on file

## 2023-03-29 NOTE — Telephone Encounter (Signed)
Patient called Sonya Gray office and states that you told her she could take Aleve and something else but she could not remember what you told her. Please advise.

## 2023-03-30 NOTE — Telephone Encounter (Signed)
Patient aware of the below message  

## 2023-04-17 ENCOUNTER — Encounter: Payer: Self-pay | Admitting: Cardiology

## 2023-04-17 ENCOUNTER — Ambulatory Visit: Payer: Medicaid Other | Attending: Cardiology | Admitting: Cardiology

## 2023-04-17 VITALS — BP 126/74 | HR 71 | Ht 60.0 in | Wt 189.6 lb

## 2023-04-17 DIAGNOSIS — G4733 Obstructive sleep apnea (adult) (pediatric): Secondary | ICD-10-CM | POA: Insufficient documentation

## 2023-04-17 NOTE — Patient Instructions (Signed)
 Medication Instructions:  Your physician recommends that you continue on your current medications as directed. Please refer to the Current Medication list given to you today.  *If you need a refill on your cardiac medications before your next appointment, please call your pharmacy*   Lab Work: None.  If you have labs (blood work) drawn today and your tests are completely normal, you will receive your results only by: MyChart Message (if you have MyChart) OR A paper copy in the mail If you have any lab test that is abnormal or we need to change your treatment, we will call you to review the results.   Testing/Procedures: None.   Follow-Up:  Your next appointment will be as needed with:     Provider:   Dr. Wilbert Bihari, MD

## 2023-04-17 NOTE — Progress Notes (Signed)
 Sleep Medicine CONSULT Note    Date:  04/17/2023   ID:  Sonya Gray, DOB [birth year], MRN [id number]  PCP:  Gerome Tillman CROME, FNP  Cardiologist: Diannah SHAUNNA Maywood, MD   Chief Complaint  Patient presents with   New Patient (Initial Visit)    OSA    History of Present Illness:  Sonya Gray is a 53 y.o. female who is being seen today for the evaluation of OSA at the request of Vishnu Mallipeddi, MD.  This is a 53 year old female who was seen by Dr. Mallipeddi for bradycardia during a colonoscopy on 12/13/2022.  Dr. Mallipeddi ordered a home sleep study which showed very mild sleep apnea with an AHI of 7.4/h with all events occurring in the supine position.  She is now referred for sleep medicine consultation for discussion of potential treatment for obstructive sleep apnea.    She tells me that she feels rested in the am and has not daytime sleepiness.  She has not been told that she snores or stops breathing in her sleep.  Stop Bang Score is 2. Her sleep study was done after getting multiple teeth pulled and had to sleep on her back (she normally does not sleep supine). She also had congestion in her nose at the time of her sleep study.   Past Medical History:  Diagnosis Date   Bradycardia    Occurred during colonoscopy   OSA (obstructive sleep apnea)    mild with AHI 7.4/hr all in supine sleep    Past Surgical History:  Procedure Laterality Date   COLONOSCOPY WITH PROPOFOL  N/A 09/29/2022   Procedure: COLONOSCOPY WITH PROPOFOL ;  Surgeon: Cindie Carlin POUR, DO;  [facility];  Service: Endoscopy;  Laterality: N/A;  11:15 am, ASA 2   TUBAL LIGATION      Current Medications: No outpatient medications have been marked as taking for the 04/17/23 encounter (Office Visit) with Shlomo Wilbert SAUNDERS, MD.    Allergies:   Cepacol [benzocaine-menthol] and Compazine [prochlorperazine]   Social History   Socioeconomic History   Marital status: Single    Spouse name: Not on  file   Number of children: Not on file   Years of education: Not on file   Highest education level: Not on file  Occupational History   Not on file  Tobacco Use   Smoking status: Never   Smokeless tobacco: Never  Vaping Use   Vaping status: Never Used  Substance and Sexual Activity   Alcohol use: Yes    Comment: rare   Drug use: Never   Sexual activity: Not on file  Other Topics Concern   Not on file  Social History Narrative   Not on file   Social Drivers of Health   Financial Resource Strain: Not on file  Food Insecurity: Not on file  Transportation Needs: Not on file  Physical Activity: Not on file  Stress: Not on file  Social Connections: Not on file     Family History:  The patient's family history includes Cancer in her father and sister; Heart disease in her mother; Heart failure in her mother.   ROS:   Please see the history of present illness.    ROS All other systems reviewed and are negative.      No data to display             PHYSICAL EXAM:   VS:  BP 126/74   Pulse 71   Ht 5' (1.524 m)  Wt 189 lb 9.6 oz (86 kg)   SpO2 99%   BMI 37.03 kg/m    GEN: Well nourished, well developed, in no acute distress  HEENT: normal  Neck: no JVD, carotid bruits, or masses Cardiac: RRR; no murmurs, rubs, or gallops,no edema.  Intact distal pulses bilaterally.  Respiratory:  clear to auscultation bilaterally, normal work of breathing GI: soft, nontender, nondistended, + BS MS: no deformity or atrophy  Skin: warm and dry, no rash Neuro:  Alert and Oriented x 3, Strength and sensation are intact Psych: euthymic mood, full affect  Wt Readings from Last 3 Encounters:  04/17/23 189 lb 9.6 oz (86 kg)  03/29/23 195 lb (88.5 kg)  12/13/22 197 lb 3.2 oz (89.4 kg)      Studies/Labs Reviewed:   Home sleep study  Recent Labs: 12/13/2022: TSH 2.090    ASSESSMENT:    1. OSA (obstructive sleep apnea)      PLAN:  In order of problems listed above:  OSA   -Her home sleep study was done because she was found to have severe bradycardia during a colonoscopy -Her STOP-BANG score is 2 -Home sleep study showed very mild obstructive sleep apnea with an AHI of 7.4/h and all events occurred during supine sleep.  She was having to sleep supine because she had just had multiple teeth removed and was very swollen in her mouth and also had problems with nasal congestion.   -she is now no longer sleeping supine -I have not recommended proceeding with CPAP therapy and is not a candidate for oral device due to recent dental surgery and she really does not have any symptoms of excessive daytime sleepiness and all her events occurred in the supine position -Recommend avoiding sleeping supine -I have recommended that she try saline nasal spray 2 sprays each nostril twice daily and if this does not improve then followup with her PCP  Followup PRN  Time Spent: 20 minutes total time of encounter, including 15 minutes spent in face-to-face patient care on the date of this encounter. This time includes coordination of care and counseling regarding above mentioned problem list. Remainder of non-face-to-face time involved reviewing chart documents/testing relevant to the patient encounter and documentation in the medical record. I have independently reviewed documentation from referring provider  Medication Adjustments/Labs and Tests Ordered: Current medicines are reviewed at length with the patient today.  Concerns regarding medicines are outlined above.  Medication changes, Labs and Tests ordered today are listed in the Patient Instructions below.  There are no Patient Instructions on file for this visit.   Signed, Wilbert Bihari, MD  04/17/2023 11:13 AM    [facility] [address] [place], [place], [place]  [address] Phone: [phone]; Fax: [phone]

## 2023-11-23 ENCOUNTER — Ambulatory Visit: Admitting: Podiatry

## 2024-01-11 ENCOUNTER — Other Ambulatory Visit (HOSPITAL_COMMUNITY): Payer: Self-pay | Admitting: Family Medicine

## 2024-01-11 DIAGNOSIS — Z1231 Encounter for screening mammogram for malignant neoplasm of breast: Secondary | ICD-10-CM

## 2024-01-31 ENCOUNTER — Ambulatory Visit (HOSPITAL_COMMUNITY)

## 2024-02-06 ENCOUNTER — Ambulatory Visit (HOSPITAL_COMMUNITY)
Admission: RE | Admit: 2024-02-06 | Discharge: 2024-02-06 | Disposition: A | Discharge status: Home / Self Care | Source: Ambulatory Visit | Attending: Family Medicine | Admitting: Family Medicine

## 2024-02-06 ENCOUNTER — Encounter (HOSPITAL_COMMUNITY): Payer: Self-pay

## 2024-02-06 DIAGNOSIS — Z1231 Encounter for screening mammogram for malignant neoplasm of breast: Secondary | ICD-10-CM | POA: Diagnosis present

## 2024-02-11 ENCOUNTER — Encounter: Payer: Self-pay | Admitting: Radiology
